# Patient Record
Sex: Male | Born: 1980 | Race: White | Hispanic: Yes | Marital: Single | Smoking: Current every day smoker
Health system: Southern US, Community
[De-identification: ages and names within clinical notes are randomized; demographics above are authoritative.]

## PROBLEM LIST (undated history)

## (undated) DIAGNOSIS — Z21 Asymptomatic human immunodeficiency virus [HIV] infection status: Secondary | ICD-10-CM

## (undated) DIAGNOSIS — N390 Urinary tract infection, site not specified: Secondary | ICD-10-CM

## (undated) DIAGNOSIS — B20 Human immunodeficiency virus [HIV] disease: Secondary | ICD-10-CM

## (undated) DIAGNOSIS — H3090 Unspecified chorioretinal inflammation, unspecified eye: Secondary | ICD-10-CM

## (undated) DIAGNOSIS — D649 Anemia, unspecified: Secondary | ICD-10-CM

## (undated) HISTORY — DX: Human immunodeficiency virus (HIV) disease: B20

## (undated) HISTORY — DX: Anemia, unspecified: D64.9

## (undated) HISTORY — DX: Asymptomatic human immunodeficiency virus (hiv) infection status: Z21

## (undated) HISTORY — PX: NO PAST SURGERIES: SHX2092

---

## 2003-10-07 ENCOUNTER — Emergency Department (HOSPITAL_COMMUNITY): Admission: EM | Admit: 2003-10-07 | Discharge: 2003-10-07 | Payer: Self-pay | Admitting: Emergency Medicine

## 2004-07-07 ENCOUNTER — Emergency Department (HOSPITAL_COMMUNITY): Admission: EM | Admit: 2004-07-07 | Discharge: 2004-07-07 | Payer: Self-pay | Admitting: Family Medicine

## 2010-04-29 ENCOUNTER — Inpatient Hospital Stay (INDEPENDENT_AMBULATORY_CARE_PROVIDER_SITE_OTHER)
Admission: RE | Admit: 2010-04-29 | Discharge: 2010-04-29 | Disposition: A | Discharge status: Home / Self Care | Payer: Self-pay | Source: Ambulatory Visit | Attending: Emergency Medicine | Admitting: Emergency Medicine

## 2010-04-29 DIAGNOSIS — B029 Zoster without complications: Secondary | ICD-10-CM

## 2011-06-16 ENCOUNTER — Emergency Department (HOSPITAL_COMMUNITY)
Admission: EM | Admit: 2011-06-16 | Discharge: 2011-06-16 | Disposition: A | Discharge status: Home / Self Care | Payer: Self-pay | Source: Home / Self Care | Attending: Family Medicine | Admitting: Family Medicine

## 2011-06-16 ENCOUNTER — Encounter (HOSPITAL_COMMUNITY): Payer: Self-pay

## 2011-06-16 DIAGNOSIS — L0231 Cutaneous abscess of buttock: Secondary | ICD-10-CM

## 2011-06-16 DIAGNOSIS — L03317 Cellulitis of buttock: Secondary | ICD-10-CM

## 2011-06-16 MED ORDER — MINOCYCLINE HCL 100 MG PO CAPS: 100.0000 mg | ORAL_CAPSULE | Freq: Two times a day (BID) | ORAL | Status: DC

## 2011-06-16 NOTE — ED Provider Notes (Signed)
Curtis Burton is a 31 y.o. male who presents to Urgent Care today for abscess on buttock. Started 1 week or so. No fevers or chills. Patient notes several tender areas along his gluteal cleft.  Yesterday he was able to get one of the abscesses to spontaneously drain.  He denies any constipation or diarrhea or abdominal pain. Aside from the tender abscesses he feels well otherwise. He works [employer].  He has no idea why he is having this as this is the first time   PMH reviewed. Otherwise healthy man ROS as above otherwise neg.  no chest pains, palpitations, fevers, chills, abdominal pain nausea or vomiting. Medications reviewed. No current facility-administered medications for this encounter.   Current Outpatient Prescriptions  Medication Sig Dispense Refill  . minocycline (MINOCIN) 100 MG capsule Take 1 capsule (100 mg total) by mouth 2 (two) times daily.  20 capsule  0    Exam:  BP 127/82  Pulse 78  Temp(Src) 98.4 F (36.9 C) (Oral)  Resp 16  SpO2 100% Gen: Well NAD Skin: Several areas of induration along the gluteal cleft bilaterally. One area on the right buttock is fluctuant.  A second area on the right buttock closer to the anus is more fluctuant and more tender.    Procedure note:  Consent obtained and timeout performed. Area cleaned with alcohol. Using a 27-gauge needle 2% lidocaine without epinephrine was injected into the 2 areas. Then a 11 blade scalpel was used to cut down to the level where pus was expressed (no more than 0.5 cm).  Then a cotton applicator was used to break any loculations and further expresse any remaining pus.  The abscess closes to the anus was larger approximately the size of a marble and was packed with packing material.  Patient tolerated the procedure well with minimal bleeding.  Assessment and Plan: 31 y.o. male with abscess on buttock.  First episode with no clear explanation. Does not display any other symptoms suggestive of Crohn's  disease. Patient is otherwise well. 2 abscesses drained several areas of induration without fluctuance. As he has several potentially forming abscesses I feel it is warranted to start oral antibiotics.  We'll use minocycline twice daily for 10 days. Encouraged followup if not improved. Additionally encouraged him to obtain the orange card so that he can followup at [facility] surgery if the symptoms persist.  Handout on abscess provided in Spanish. Patient expresses understanding.     Rodolph Bong, MD 06/16/11 [phone]

## 2011-06-16 NOTE — Discharge Instructions (Signed)
Gracias por venir hoy.  Please see Rudell Cobb to qualify for reduced or free medical services within the [facility].  Call her at [phone] today.

## 2011-06-16 NOTE — ED Notes (Signed)
Has another boil in his buttocks area

## 2011-06-17 NOTE — ED Provider Notes (Signed)
Medical screening examination/treatment/procedure(s) were performed by PGY-3 FM resident and as supervising physician I was immediately available for consultation/collaboration.   Jessenya Berdan Moreno-Coll, MD   Desha Bitner Moreno-Coll, MD 06/17/11 0032 

## 2011-09-09 ENCOUNTER — Encounter (HOSPITAL_COMMUNITY): Payer: Self-pay | Admitting: Emergency Medicine

## 2011-09-09 ENCOUNTER — Emergency Department (HOSPITAL_COMMUNITY)
Admission: EM | Admit: 2011-09-09 | Discharge: 2011-09-09 | Disposition: A | Discharge status: Home / Self Care | Payer: Self-pay | Source: Home / Self Care | Attending: Emergency Medicine | Admitting: Emergency Medicine

## 2011-09-09 DIAGNOSIS — N390 Urinary tract infection, site not specified: Secondary | ICD-10-CM

## 2011-09-09 LAB — POCT URINALYSIS DIP (DEVICE)
Bilirubin Urine: NEGATIVE
Protein, ur: 30 mg/dL — AB

## 2011-09-09 MED ORDER — SULFAMETHOXAZOLE-TRIMETHOPRIM 800-160 MG PO TABS: 1.0000 | ORAL_TABLET | Freq: Two times a day (BID) | ORAL | Status: DC

## 2011-09-09 NOTE — ED Notes (Signed)
uti symptoms

## 2011-09-09 NOTE — ED Provider Notes (Signed)
Medical screening examination/treatment/procedure(s) were performed by non-physician practitioner and as supervising physician I was immediately available for consultation/collaboration.  Raynald Blend, MD 09/09/11 2025

## 2011-09-09 NOTE — ED Provider Notes (Signed)
History     CSN: [id number]  Arrival date & time 09/09/11  1120   First MD Initiated Contact with Patient 09/09/11 1133      Chief Complaint  Patient presents with  . Urinary Tract Infection    (Consider location/radiation/quality/duration/timing/severity/associated sxs/prior treatment) HPI Comments: Pt c/o generalized lower back pain and suprapubic pain  Patient is a 31 y.o. male presenting with dysuria. The history is provided by the patient. No language interpreter was used.  Dysuria  This is a new problem. The current episode started more than 2 days ago. The problem has not changed since onset.The quality of the pain is described as burning. The pain is mild. There has been no fever. Associated symptoms include frequency. Pertinent negatives include no nausea, no vomiting, no discharge and no hematuria. His past medical history does not include kidney stones or recurrent UTIs.    History reviewed. No pertinent past medical history.  History reviewed. No pertinent past surgical history.  No family history on file.  History  Substance Use Topics  . Smoking status: Never Smoker   . Smokeless tobacco: Not on file  . Alcohol Use: No      Review of Systems  Constitutional: Negative.   Respiratory: Negative.   Cardiovascular: Negative.   Gastrointestinal: Negative for nausea and vomiting.  Genitourinary: Positive for dysuria and frequency. Negative for hematuria.       Pt denies penile discharge    Allergies  Review of patient's allergies indicates no known allergies.  Home Medications   Current Outpatient Rx  Name Route Sig Dispense Refill  . MINOCYCLINE HCL 100 MG PO CAPS Oral Take 1 capsule (100 mg total) by mouth 2 (two) times daily. 20 capsule 0    BP 132/80  Pulse 90  Temp 98.9 F (37.2 C) (Oral)  Resp 20  SpO2 100%  Physical Exam  Nursing note and vitals reviewed. Constitutional: He appears well-developed and well-nourished.  HENT:  Head:  Atraumatic.  Eyes: Conjunctivae and EOM are normal.  Neck: Neck supple.  Cardiovascular: Normal rate and regular rhythm.   Pulmonary/Chest: Breath sounds normal.  Abdominal: Soft. Bowel sounds are normal. There is no CVA tenderness.       Suprapubic tenderness  Genitourinary:       No penile discharge noted    ED Course  Procedures (including critical care time)  Labs Reviewed  POCT URINALYSIS DIP (DEVICE) - Abnormal; Notable for the following:    Hgb urine dipstick TRACE (*)     Protein, ur 30 (*)     Leukocytes, UA TRACE (*)  Biochemical Testing Only. Please order routine urinalysis from main lab if confirmatory testing is needed.   All other components within normal limits  GC/CHLAMYDIA PROBE AMP, GENITAL   No results found.   1. UTI (lower urinary tract infection)       MDM  Will treat for WUJ:WJXBJ kidney stone as pt not having focal pain to one side:pt had no discharge although cultures sent       Teressa Lower, NP 09/09/11 1212

## 2011-09-10 ENCOUNTER — Emergency Department (HOSPITAL_COMMUNITY)
Admission: EM | Admit: 2011-09-10 | Discharge: 2011-09-10 | Disposition: A | Discharge status: Home / Self Care | Payer: Self-pay | Attending: Emergency Medicine | Admitting: Emergency Medicine

## 2011-09-10 ENCOUNTER — Encounter (HOSPITAL_COMMUNITY): Payer: Self-pay | Admitting: *Deleted

## 2011-09-10 DIAGNOSIS — N390 Urinary tract infection, site not specified: Secondary | ICD-10-CM | POA: Insufficient documentation

## 2011-09-10 DIAGNOSIS — N139 Obstructive and reflux uropathy, unspecified: Secondary | ICD-10-CM | POA: Insufficient documentation

## 2011-09-10 LAB — URINALYSIS, ROUTINE W REFLEX MICROSCOPIC
Bilirubin Urine: NEGATIVE
Glucose, UA: NEGATIVE mg/dL
Ketones, ur: NEGATIVE mg/dL
Nitrite: NEGATIVE
Protein, ur: NEGATIVE mg/dL
Specific Gravity, Urine: 1.009 (ref 1.005–1.030)
Urobilinogen, UA: 0.2 mg/dL (ref 0.0–1.0)

## 2011-09-10 MED ORDER — LIDOCAINE HCL 2 % EX GEL
CUTANEOUS | Status: AC
  Filled 2011-09-10: qty 20

## 2011-09-10 NOTE — ED Notes (Signed)
PT states that he was recently tx for uti.  However, since then, pt is unable to urinate and is experiencing increasing urethral burning and suprapubic pain.

## 2011-09-10 NOTE — ED Notes (Signed)
Difficulty urinating X several days, dribbling, and burning, sx are constant, worsening and associated with abd pain and distention, tx for UTI with bactrim X 24 hours without relief.  No otc meds, no anticholinergism, no drugs, no PMH.  PE>  Distended abdomen, tender in the suprapubic area, upper abdomen is soft and nontender. No peripheral edema, clear heart and lungs, no conjunctival icterus or jaundice, patient following commands without difficulty, moving all 4 extremities.  GU exam normal, no d/c, no swelling, no redness  Assessment bedside ultrasound shows distended bladder, penis and testicles and scrotum appear normal, uncircumcised, no discharge at the urethral meatus. Foley catheter placed, urology followup encouraged, all questions of the patient and family member answered  UA clean, no UTI, cutlures sent.  Medical screening examination/treatment/procedure(s) were conducted as a shared visit with non-physician practitioner(s) and myself.  I personally evaluated the patient during the encounter    Vida Roller, MD 09/10/11 [phone]

## 2011-09-10 NOTE — ED Notes (Signed)
Pt states he was seen at urgent care last week and dx with UTI, given meds but states the pain is worse since this past Tuesday.  C/o fever, abdominal pain, and bilateral flank pain.

## 2011-09-10 NOTE — ED Provider Notes (Signed)
History     CSN: [id number]  Arrival date & time 09/10/11  0120   First MD Initiated Contact with Patient 09/10/11 0154      Chief Complaint  Patient presents with  . Flank Pain  . Abdominal Pain    (Consider location/radiation/quality/duration/timing/severity/associated sxs/prior treatment) HPI Comments: Curtis Burton 31 y.o. male   The chief complaint is: Patient presents with:   Flank Pain   Abdominal Pain   History reviewed. No pertinent past medical history.  Patient seen in ER 09/09/11 and diagnosed with UTI. Symptoms of dysuria, difficult urination, flank pain, fever present since last Tuesday. Patient also reported subjective high fevers and night sweats that caused a herpetic vesicular outbreak in his mouth and nose. Today he presents because he is unable to urinate at all. Reports all of the same prior symptoms in addition to suprapubic pain. Denies NVD. Denies hematuria. Denies history of prostate problems.      Patient is a 31 y.o. male presenting with flank pain and abdominal pain. The history is provided by the patient and the spouse.  Flank Pain Associated symptoms include abdominal pain, chills, diaphoresis and a fever. Pertinent negatives include no nausea or vomiting.  Abdominal Pain The primary symptoms of the illness include abdominal pain, fever and dysuria. The primary symptoms of the illness do not include nausea, vomiting or diarrhea.  The dysuria is not associated with penile pain.  Additional symptoms associated with the illness include chills and diaphoresis.    History reviewed. No pertinent past medical history.  History reviewed. No pertinent past surgical history.  History reviewed. No pertinent family history.  History  Substance Use Topics  . Smoking status: Never Smoker   . Smokeless tobacco: Not on file  . Alcohol Use: No      Review of Systems  Constitutional: Positive for fever, chills and diaphoresis.  HENT:  Positive for mouth sores.   Gastrointestinal: Positive for abdominal pain. Negative for nausea, vomiting and diarrhea.  Genitourinary: Positive for dysuria, flank pain and difficulty urinating. Negative for discharge and penile pain.    Allergies  Review of patient's allergies indicates no known allergies.  Home Medications   Current Outpatient Rx  Name Route Sig Dispense Refill  . IBUPROFEN 200 MG PO TABS Oral Take 400 mg by mouth every 6 (six) hours as needed. pain    . SULFAMETHOXAZOLE-TRIMETHOPRIM 800-160 MG PO TABS Oral Take 1 tablet by mouth every 12 (twelve) hours. 10 tablet 0    BP 122/81  Pulse 87  Temp 98.2 F (36.8 C) (Oral)  Resp 16  SpO2 97%  Physical Exam  Constitutional: He appears well-developed and well-nourished.  HENT:  Head: Normocephalic and atraumatic.  Mouth/Throat: Oropharynx is clear and moist.  Cardiovascular: Normal rate, regular rhythm and normal heart sounds.   Pulmonary/Chest: Effort normal and breath sounds normal.  Abdominal: Soft. Bowel sounds are normal. He exhibits distension. There is tenderness. Hernia confirmed negative in the right inguinal area and confirmed negative in the left inguinal area.       Abdomen mildly distended. Tenderness of suprapubic area on palpation.  Genitourinary: Testes normal and penis normal. Right testis shows no swelling and no tenderness. Left testis shows no swelling and no tenderness. Uncircumcised. No penile erythema or penile tenderness. No discharge found.  Neurological: He is alert.  Skin: Skin is warm and dry.    ED Course  Procedures (including critical care time)   Labs Reviewed  URINALYSIS, ROUTINE W REFLEX MICROSCOPIC  URINE CULTURE   No results found.  Results for orders placed during the hospital encounter of 09/10/11  URINALYSIS, ROUTINE W REFLEX MICROSCOPIC      Component Value Range   Color, Urine YELLOW  YELLOW   APPearance CLEAR  CLEAR   Specific Gravity, Urine 1.009  1.005 - 1.030     pH 6.5  5.0 - 8.0   Glucose, UA NEGATIVE  NEGATIVE mg/dL   Hgb urine dipstick NEGATIVE  NEGATIVE   Bilirubin Urine NEGATIVE  NEGATIVE   Ketones, ur NEGATIVE  NEGATIVE mg/dL   Protein, ur NEGATIVE  NEGATIVE mg/dL   Urobilinogen, UA 0.2  0.0 - 1.0 mg/dL   Nitrite NEGATIVE  NEGATIVE   Leukocytes, UA NEGATIVE  NEGATIVE    1. Urinary (tract) obstruction       MDM  31 y/o male previously diagnosed with UTI with worsening symptoms. Bedside US revealed urinary obstruction. Foley catheter with foley leg bag ordered and placed. UA: unremarkable. Urine culture & GC/Chlamydia pending. Patient given instructions to follow-up with [facility] in addition to return precautions.        Pixie Casino, PA-C 09/10/11 [phone]

## 2011-09-10 NOTE — ED Provider Notes (Signed)
Medical screening examination/treatment/procedure(s) were conducted as a shared visit with non-physician practitioner(s) and myself.  I personally evaluated the patient during the encounter  Please see my separate respective documentation pertaining to this patient encounter   Vida Roller, MD 09/10/11 [phone]

## 2011-09-10 NOTE — ED Notes (Signed)
Pt discharged home.GCS 15

## 2011-09-10 NOTE — ED Notes (Signed)
Foley catheter placed by EMT Jeannett Senior.

## 2011-09-11 LAB — URINE CULTURE
Colony Count: NO GROWTH
Culture: NO GROWTH

## 2011-09-11 LAB — GC/CHLAMYDIA PROBE AMP, GENITAL: Chlamydia, DNA Probe: NEGATIVE

## 2011-09-13 ENCOUNTER — Encounter (HOSPITAL_COMMUNITY): Payer: Self-pay | Admitting: *Deleted

## 2011-09-13 DIAGNOSIS — N390 Urinary tract infection, site not specified: Secondary | ICD-10-CM | POA: Insufficient documentation

## 2011-09-13 LAB — URINALYSIS, ROUTINE W REFLEX MICROSCOPIC
Glucose, UA: NEGATIVE mg/dL
Nitrite: POSITIVE — AB
Protein, ur: >300 mg/dL — AB
pH: 6 (ref 5.0–8.0)

## 2011-09-13 LAB — URINE MICROSCOPIC-ADD ON

## 2011-09-13 NOTE — ED Notes (Signed)
PT was tx here on Monday for a uti and sent home with a foley leg bag.  His symptoms were relieved, but Monday he began to experience urinary burning slightly blood-tinged urine and suprapubic pain.  He called specialist and they stated they have no openings until Wed.  Pt states he cannot wait that long.

## 2011-09-14 ENCOUNTER — Emergency Department (HOSPITAL_COMMUNITY): Payer: Self-pay

## 2011-09-14 ENCOUNTER — Encounter (HOSPITAL_COMMUNITY): Payer: Self-pay | Admitting: Radiology

## 2011-09-14 ENCOUNTER — Emergency Department (HOSPITAL_COMMUNITY)
Admission: EM | Admit: 2011-09-14 | Discharge: 2011-09-14 | Disposition: A | Discharge status: Home / Self Care | Payer: Self-pay | Attending: Emergency Medicine | Admitting: Emergency Medicine

## 2011-09-14 DIAGNOSIS — R319 Hematuria, unspecified: Secondary | ICD-10-CM

## 2011-09-14 HISTORY — DX: Urinary tract infection, site not specified: N39.0

## 2011-09-14 LAB — COMPREHENSIVE METABOLIC PANEL WITH GFR
ALT: 30 U/L (ref 0–53)
AST: 24 U/L (ref 0–37)
Albumin: 3.8 g/dL (ref 3.5–5.2)
Alkaline Phosphatase: 106 U/L (ref 39–117)
BUN: 15 mg/dL (ref 6–23)
CO2: 22 meq/L (ref 19–32)
Calcium: 9.2 mg/dL (ref 8.4–10.5)
Chloride: 99 meq/L (ref 96–112)
Creatinine, Ser: 0.78 mg/dL (ref 0.50–1.35)
GFR calc Af Amer: >90 mL/min
GFR calc non Af Amer: >90 mL/min
Glucose, Bld: 92 mg/dL (ref 70–99)
Potassium: 4.3 meq/L (ref 3.5–5.1)
Sodium: 134 meq/L — ABNORMAL LOW (ref 135–145)
Total Bilirubin: 0.2 mg/dL — ABNORMAL LOW (ref 0.3–1.2)
Total Protein: 9 g/dL — ABNORMAL HIGH (ref 6.0–8.3)

## 2011-09-14 LAB — POCT I-STAT, CHEM 8
BUN: 16 mg/dL (ref 6–23)
Calcium, Ion: 1.19 mmol/L (ref 1.12–1.23)
Chloride: 104 meq/L (ref 96–112)
Creatinine, Ser: 0.9 mg/dL (ref 0.50–1.35)
Glucose, Bld: 94 mg/dL (ref 70–99)
HCT: 43 % (ref 39.0–52.0)
Hemoglobin: 14.6 g/dL (ref 13.0–17.0)
Potassium: 4.5 meq/L (ref 3.5–5.1)
Sodium: 138 meq/L (ref 135–145)
TCO2: 21 mmol/L (ref 0–100)

## 2011-09-14 LAB — CBC
HCT: 39.4 % (ref 39.0–52.0)
Hemoglobin: 14.3 g/dL (ref 13.0–17.0)
MCH: 32.5 pg (ref 26.0–34.0)
MCHC: 36.3 g/dL — ABNORMAL HIGH (ref 30.0–36.0)
MCV: 89.5 fL (ref 78.0–100.0)
Platelets: 304 K/uL (ref 150–400)
RBC: 4.4 MIL/uL (ref 4.22–5.81)
RDW: 11.9 % (ref 11.5–15.5)
WBC: 7.6 K/uL (ref 4.0–10.5)

## 2011-09-14 LAB — CK: Total CK: 40 U/L (ref 7–232)

## 2011-09-14 MED ORDER — TRAMADOL HCL 50 MG PO TABS
50.0000 mg | ORAL_TABLET | Freq: Once | ORAL | Status: AC
  Administered 2011-09-14: 50 mg via ORAL
  Filled 2011-09-14: qty 1

## 2011-09-14 MED ORDER — IOHEXOL 300 MG/ML  SOLN
100.0000 mL | Freq: Once | INTRAMUSCULAR | Status: AC | PRN
  Administered 2011-09-14: 100 mL via INTRAVENOUS

## 2011-09-14 MED ORDER — SODIUM CHLORIDE 0.9 % IV BOLUS (SEPSIS)
1000.0000 mL | Freq: Once | INTRAVENOUS | Status: AC
  Administered 2011-09-14: 1000 mL via INTRAVENOUS

## 2011-09-14 MED ORDER — TRAMADOL HCL 50 MG PO TABS: 50.0000 mg | ORAL_TABLET | Freq: Four times a day (QID) | ORAL | Status: DC | PRN

## 2011-09-14 MED ORDER — TAMSULOSIN HCL 0.4 MG PO CAPS: 0.4000 mg | ORAL_CAPSULE | Freq: Every day | ORAL | Status: DC

## 2011-09-14 NOTE — ED Notes (Signed)
Wife st's pt was seen here on Sat and dx with UTI st's he was having problems urinating.  Pt still has cath in place. St's there has been blood in urine.  Pt is currently taking Bactrim.  Pt's wife st's she called urologist to obtain an appt. But could not get one till next week.

## 2011-09-14 NOTE — ED Notes (Signed)
Patient requested something for pain before leaving.  MD notified

## 2011-09-14 NOTE — ED Provider Notes (Signed)
History     CSN: [id number]  Arrival date & time 09/13/11  2153   First MD Initiated Contact with Patient 09/14/11 0020      Chief Complaint  Patient presents with  . Urinary Tract Infection    complictions    (Consider location/radiation/quality/duration/timing/severity/associated sxs/prior treatment) HPI History per patient. Has Foley catheter in place for urinary retention. Evaluated here a few days ago for UTI and prescribed antibiotics. He returned a few days later with retention had a Foley placed. He was referred to urologist and did call urologist earlier today and is scheduled followup in 10 days with [facility]. Today he is having some suprapubic discomfort with blood in his urine and presents for further evaluation. Did have a fever a week ago but no fever since. No nausea or vomiting. No lateralizing pain. Pain is mild. No sore throat. No rash. No back pain. No history of UTIs previously. Past Medical History  Diagnosis Date  . UTI (urinary tract infection)     History reviewed. No pertinent past surgical history.  No family history on file.  History  Substance Use Topics  . Smoking status: Never Smoker   . Smokeless tobacco: Not on file  . Alcohol Use: No      Review of Systems  Constitutional: Negative for fever and chills.  HENT: Negative for neck pain and neck stiffness.   Eyes: Negative for pain.  Respiratory: Negative for shortness of breath.   Cardiovascular: Negative for chest pain.  Gastrointestinal: Negative for abdominal pain.  Genitourinary: Positive for hematuria. Negative for flank pain, discharge, penile swelling, scrotal swelling and testicular pain.  Musculoskeletal: Negative for back pain.  Skin: Negative for rash.  Neurological: Negative for headaches.  All other systems reviewed and are negative.    Allergies  Review of patient's allergies indicates no known allergies.  Home Medications   Current Outpatient Rx  Name  Route Sig Dispense Refill  . IBUPROFEN 200 MG PO TABS Oral Take 400 mg by mouth every 6 (six) hours as needed. pain    . SULFAMETHOXAZOLE-TRIMETHOPRIM 800-160 MG PO TABS Oral Take 1 tablet by mouth every 12 (twelve) hours. 10 tablet 0    BP 128/74  Pulse 90  Temp 98.4 F (36.9 C) (Oral)  Resp 16  SpO2 97%  Physical Exam  Constitutional: He is oriented to person, place, and time. He appears well-developed and well-nourished.  HENT:  Head: Normocephalic and atraumatic.  Eyes: Conjunctivae and EOM are normal. Pupils are equal, round, and reactive to light.  Neck: Trachea normal. Neck supple. No thyromegaly present.  Cardiovascular: Normal rate, regular rhythm, S1 normal, S2 normal and normal pulses.     No systolic murmur is present   No diastolic murmur is present  Pulses:      Radial pulses are 2+ on the right side, and 2+ on the left side.  Pulmonary/Chest: Effort normal and breath sounds normal. He has no wheezes. He has no rhonchi. He has no rales. He exhibits no tenderness.  Abdominal: Soft. Normal appearance and bowel sounds are normal. He exhibits no distension. There is no rebound, no guarding, no CVA tenderness and negative Murphy's sign.       Mild suprapubic tenderness. No tenderness over McBurney's point. No peritonitis. Foley catheter in place with hematuria but no blood clots visualized. No fullness or distention over the bladder  Musculoskeletal:       BLE:s Calves nontender, no cords or erythema, negative Homans sign  Neurological:  He is alert and oriented to person, place, and time. He has normal strength. No cranial nerve deficit or sensory deficit. GCS eye subscore is 4. GCS verbal subscore is 5. GCS motor subscore is 6.  Skin: Skin is warm and dry. No rash noted. He is not diaphoretic.  Psychiatric: His speech is normal.       Cooperative and appropriate    ED Course  Procedures (including critical care time)  Results for orders placed during the hospital  encounter of 09/14/11  URINALYSIS, ROUTINE W REFLEX MICROSCOPIC      Component Value Range   Color, Urine BROWN (*) YELLOW   APPearance TURBID (*) CLEAR   Specific Gravity, Urine 1.037 (*) 1.005 - 1.030   pH 6.0  5.0 - 8.0   Glucose, UA NEGATIVE  NEGATIVE mg/dL   Hgb urine dipstick LARGE (*) NEGATIVE   Bilirubin Urine LARGE (*) NEGATIVE   Ketones, ur 15 (*) NEGATIVE mg/dL   Protein, ur >045 (*) NEGATIVE mg/dL   Urobilinogen, UA 0.2  0.0 - 1.0 mg/dL   Nitrite POSITIVE (*) NEGATIVE   Leukocytes, UA SMALL (*) NEGATIVE  URINE MICROSCOPIC-ADD ON      Component Value Range   Squamous Epithelial / LPF RARE  RARE   WBC, UA 3-6  <3 WBC/hpf   RBC / HPF TOO NUMEROUS TO COUNT  <3 RBC/hpf   Bacteria, UA FEW (*) RARE   Crystals CA OXALATE CRYSTALS (*) NEGATIVE  CBC      Component Value Range   WBC 7.6  4.0 - 10.5 K/uL   RBC 4.40  4.22 - 5.81 MIL/uL   Hemoglobin 14.3  13.0 - 17.0 g/dL   HCT 40.9  81.1 - 91.4 %   MCV 89.5  78.0 - 100.0 fL   MCH 32.5  26.0 - 34.0 pg   MCHC 36.3 (*) 30.0 - 36.0 g/dL   RDW 78.2  95.6 - 21.3 %   Platelets 304  150 - 400 K/uL  COMPREHENSIVE METABOLIC PANEL      Component Value Range   Sodium 134 (*) 135 - 145 mEq/L   Potassium 4.3  3.5 - 5.1 mEq/L   Chloride 99  96 - 112 mEq/L   CO2 22  19 - 32 mEq/L   Glucose, Bld 92  70 - 99 mg/dL   BUN 15  6 - 23 mg/dL   Creatinine, Ser 0.86  0.50 - 1.35 mg/dL   Calcium 9.2  8.4 - 57.8 mg/dL   Total Protein 9.0 (*) 6.0 - 8.3 g/dL   Albumin 3.8  3.5 - 5.2 g/dL   AST 24  0 - 37 U/L   ALT 30  0 - 53 U/L   Alkaline Phosphatase 106  39 - 117 U/L   Total Bilirubin 0.2 (*) 0.3 - 1.2 mg/dL   GFR calc non Af Amer >90  >90 mL/min   GFR calc Af Amer >90  >90 mL/min  CK      Component Value Range   Total CK 40  7 - 232 U/L  POCT I-STAT, CHEM 8      Component Value Range   Sodium 138  135 - 145 mEq/L   Potassium 4.5  3.5 - 5.1 mEq/L   Chloride 104  96 - 112 mEq/L   BUN 16  6 - 23 mg/dL   Creatinine, Ser 4.69  0.50 - 1.35  mg/dL   Glucose, Bld 94  70 - 99 mg/dL   Calcium, Ion  1.19  1.12 - 1.23 mmol/L   TCO2 21  0 - 100 mmol/L   Hemoglobin 14.6  13.0 - 17.0 g/dL   HCT 16.1  09.6 - 04.5 %   Ct Abdomen Pelvis Wo Contrast  09/14/2011  *RADIOLOGY REPORT*  Clinical Data: The low pelvic pain and hematuria for 1 week.  CT ABDOMEN AND PELVIS WITHOUT CONTRAST  Technique:  Multidetector CT imaging of the abdomen and pelvis was performed following the standard protocol without intravenous contrast.  Comparison: None.  Findings: Dependent changes in the lung bases.  The kidneys appear symmetrical in size and shape.  No renal, ureteral, or bladder stones are visualized.  No pyelocaliectasis or ureterectasis.  A Foley catheter decompresses the bladder.  This limits evaluation of the bladder.  The prostate gland is enlarged, measuring 4.6 x 5.6 cm.  The unenhanced appearance of the liver, spleen, gallbladder, pancreas, adrenal glands, abdominal aorta, and retroperitoneal lymph nodes is unremarkable.  The stomach, small bowel, and colon are not abnormally distended.  Diffusely stool filled colon.  No free air or free fluid in the abdomen.  Pelvis:  No free or loculated pelvic fluid collections.  No significant pelvic lymphadenopathy.  Scattered colonic diverticula without diverticulitis.  The appendix is normal.  Normal alignment of the lumbar spine.  IMPRESSION: No renal or ureteral stone or obstruction.  Foley catheter decompresses the bladder.  Prostate gland enlargement.  Original Report Authenticated By: Marlon Pel, M.D.     12:53 AM d/w Urologist on call, Dr Mena Goes, as above -relates that PT does not need emergent follow up and 10 day appt for foley removal is appropriate. CT scan is appropriate at this point to evaluate for stone. Options include voiding trial with foley removal now or RX tamulosin and f/u with Urology as planned for cystoscopy.    MDM   Suprapubic pain status post treatment for UTI and Foley catheter  placement for urinary retention. Old records reviewed. Negative urine culture. Has one pill left in prescription of antibiotics. Now with hematuria. UA, labs and CT scan obtained and reviewed as above. Patient declines any pain medications serial exams unchanged. given enlarged prostate, will prescribe Tamulosin as above plan followup with urologist as scheduled. Patient is comfortable for plan discharge home and outpatient followup.        Sunnie Nielsen, MD 09/14/11 [phone]

## 2011-09-14 NOTE — Discharge Instructions (Signed)
Continue medications as prescribed. Continue wearing Foley catheter as instructed. Be evaluated sooner for fevers, vomiting, severe pain or any worsening condition. Otherwise keep your scheduled followup with [facility].

## 2011-09-16 LAB — URINE CULTURE: Colony Count: 15000

## 2011-09-21 ENCOUNTER — Emergency Department (HOSPITAL_COMMUNITY)
Admission: EM | Admit: 2011-09-21 | Discharge: 2011-09-21 | Disposition: A | Discharge status: Home / Self Care | Payer: Self-pay | Attending: Emergency Medicine | Admitting: Emergency Medicine

## 2011-09-21 ENCOUNTER — Encounter (HOSPITAL_COMMUNITY): Payer: Self-pay | Admitting: *Deleted

## 2011-09-21 DIAGNOSIS — R339 Retention of urine, unspecified: Secondary | ICD-10-CM | POA: Insufficient documentation

## 2011-09-21 DIAGNOSIS — F17200 Nicotine dependence, unspecified, uncomplicated: Secondary | ICD-10-CM | POA: Insufficient documentation

## 2011-09-21 LAB — URINALYSIS, ROUTINE W REFLEX MICROSCOPIC
Bilirubin Urine: NEGATIVE
Ketones, ur: NEGATIVE mg/dL
Nitrite: NEGATIVE
Specific Gravity, Urine: 1.016 (ref 1.005–1.030)
Urobilinogen, UA: 0.2 mg/dL (ref 0.0–1.0)

## 2011-09-21 NOTE — ED Provider Notes (Signed)
History     CSN: [id number]  Arrival date & time 09/21/11  0002   First MD Initiated Contact with Patient 09/21/11 0034      Chief Complaint  Patient presents with  . Urinary Retention    (Consider location/radiation/quality/duration/timing/severity/associated sxs/prior treatment) HPI Comments: 31 year old male with no past medical history other than recent urinary retention presents with lower abdominal pain. According to the patient and his family member he had a Foley catheter removed at the urologist office approximately 14 hours ago. He was able to urinate once immediately after this but since that time has not been able to pass any urine whatsoever from his penis. He denies flank pain fevers chills nausea or vomiting but does admit to having gradually worsening lower abdominal pain and distention. Review of the medical records shows that he had a recent urinalysis showing no signs of urinary tract infection on culture results with only 15,000 colonies. He is currently taking Flomax without any improvement.  The history is provided by the patient and a relative.    Past Medical History  Diagnosis Date  . UTI (urinary tract infection)     History reviewed. No pertinent past surgical history.  History reviewed. No pertinent family history.  History  Substance Use Topics  . Smoking status: Current Everyday Smoker -- 0.5 packs/day  . Smokeless tobacco: Not on file  . Alcohol Use: No      Review of Systems  All other systems reviewed and are negative.    Allergies  Review of patient's allergies indicates no known allergies.  Home Medications   Current Outpatient Rx  Name Route Sig Dispense Refill  . IBUPROFEN 200 MG PO TABS Oral Take 400 mg by mouth every 6 (six) hours as needed. pain    . TAMSULOSIN HCL 0.4 MG PO CAPS Oral Take 1 capsule (0.4 mg total) by mouth daily after breakfast. 30 capsule 0    BP 123/70  Pulse 79  Temp 98.1 F (36.7 C) (Oral)  Resp 16   SpO2 97%  Physical Exam  Nursing note and vitals reviewed. Constitutional: He appears well-developed and well-nourished. No distress.  HENT:  Head: Normocephalic and atraumatic.  Mouth/Throat: Oropharynx is clear and moist. No oropharyngeal exudate.  Eyes: Conjunctivae and EOM are normal. Pupils are equal, round, and reactive to light. Right eye exhibits no discharge. Left eye exhibits no discharge. No scleral icterus.  Neck: Normal range of motion. Neck supple. No JVD present. No thyromegaly present.  Cardiovascular: Normal rate, regular rhythm, normal heart sounds and intact distal pulses.  Exam reveals no gallop and no friction rub.   No murmur heard. Pulmonary/Chest: Effort normal and breath sounds normal. No respiratory distress. He has no wheezes. He has no rales.  Abdominal: Soft. Bowel sounds are normal. He exhibits distension ( Mild distention in the infraumbilical area). He exhibits no mass. There is tenderness ( Mild suprapubic tenderness with no guarding).       No peritoneal signs  Musculoskeletal: Normal range of motion. He exhibits no edema and no tenderness.  Lymphadenopathy:    He has no cervical adenopathy.  Neurological: He is alert. Coordination normal.  Skin: Skin is warm and dry. No rash noted. No erythema.  Psychiatric: He has a normal mood and affect. His behavior is normal.    ED Course  Procedures (including critical care time)   Labs Reviewed  URINALYSIS, ROUTINE W REFLEX MICROSCOPIC   No results found.   1. Urinary retention  MDM  CT scan reviewed from prior visit, no signs of ureteral calculi, no other signs to suggest a cause for a wet obstruction. Urinalysis ordered, Foley catheter to be placed and followup with urology. The patient is not a fever tachycardia hypotension or any other sources of illness.   Urinary catheter placed, 800 cc plus of urine returned, urinalysis reviewed showing no signs of infection, vital signs normal, patient  advised to return to urology in coming week. Patient appears stable for discharge  Vida Roller, MD 09/21/11 [phone]

## 2011-09-21 NOTE — ED Notes (Signed)
Pt given discharge and follow up instructions after speaking with provider. Denies further needs at this time. Ambulates to lobby in NAD  

## 2011-09-21 NOTE — ED Notes (Signed)
800 ML urine out in foley bag. Changed to leg bag per order. Pt reports moderate relief of pain after foley catheter placement. Continues to report mild back pain. MD aware

## 2011-09-21 NOTE — ED Notes (Signed)
Pt c/o urinary retention, states went to PCP today for urinary retention.  Straight cath was done to relieve around 10:30 AM.  Pt states he has not urinated since.

## 2011-11-06 ENCOUNTER — Telehealth: Payer: Self-pay

## 2011-11-06 NOTE — Telephone Encounter (Signed)
Pt informed of new intake appointment on November 16, 2011 @ 3pm He is spanish speaking only.  Pt informed a Artist will be calling regarding information to bring for services.   Laurell Josephs, RN

## 2011-11-16 ENCOUNTER — Ambulatory Visit: Payer: Self-pay

## 2012-03-06 ENCOUNTER — Inpatient Hospital Stay (HOSPITAL_COMMUNITY)
Admission: AD | Admit: 2012-03-06 | Discharge: 2012-03-08 | DRG: 124 | Disposition: A | Discharge status: Home / Self Care | Payer: Medicaid Other | Source: Ambulatory Visit | Attending: Internal Medicine | Admitting: Internal Medicine

## 2012-03-06 ENCOUNTER — Encounter (HOSPITAL_COMMUNITY): Payer: Self-pay | Admitting: General Practice

## 2012-03-06 ENCOUNTER — Telehealth: Payer: Self-pay | Admitting: Internal Medicine

## 2012-03-06 DIAGNOSIS — H30039 Focal chorioretinal inflammation, peripheral, unspecified eye: Principal | ICD-10-CM | POA: Diagnosis present

## 2012-03-06 DIAGNOSIS — R634 Abnormal weight loss: Secondary | ICD-10-CM | POA: Diagnosis present

## 2012-03-06 DIAGNOSIS — D649 Anemia, unspecified: Secondary | ICD-10-CM | POA: Diagnosis present

## 2012-03-06 DIAGNOSIS — F17200 Nicotine dependence, unspecified, uncomplicated: Secondary | ICD-10-CM | POA: Diagnosis present

## 2012-03-06 DIAGNOSIS — D638 Anemia in other chronic diseases classified elsewhere: Secondary | ICD-10-CM | POA: Diagnosis present

## 2012-03-06 DIAGNOSIS — B20 Human immunodeficiency virus [HIV] disease: Secondary | ICD-10-CM | POA: Diagnosis present

## 2012-03-06 DIAGNOSIS — IMO0002 Reserved for concepts with insufficient information to code with codable children: Secondary | ICD-10-CM

## 2012-03-06 HISTORY — DX: Unspecified chorioretinal inflammation, unspecified eye: H30.90

## 2012-03-06 LAB — CBC WITH DIFFERENTIAL/PLATELET
Basophils Absolute: 0 10*3/uL (ref 0.0–0.1)
Basophils Relative: 0 % (ref 0–1)
Eosinophils Absolute: 0.1 10*3/uL (ref 0.0–0.7)
MCH: 30.7 pg (ref 26.0–34.0)
MCHC: 34.8 g/dL (ref 30.0–36.0)
Neutrophils Relative %: 70 % (ref 43–77)
Platelets: 204 10*3/uL (ref 150–400)
RBC: 3.81 MIL/uL — ABNORMAL LOW (ref 4.22–5.81)
RDW: 12.9 % (ref 11.5–15.5)

## 2012-03-06 LAB — COMPREHENSIVE METABOLIC PANEL
CO2: 26 mEq/L (ref 19–32)
Calcium: 8.8 mg/dL (ref 8.4–10.5)
Creatinine, Ser: 0.57 mg/dL (ref 0.50–1.35)
GFR calc Af Amer: >90 mL/min (ref >90)
GFR calc non Af Amer: >90 mL/min (ref >90)
Glucose, Bld: 121 mg/dL — ABNORMAL HIGH (ref 70–99)
Total Bilirubin: 0.2 mg/dL — ABNORMAL LOW (ref 0.3–1.2)

## 2012-03-06 LAB — PROTIME-INR: Prothrombin Time: 14.1 seconds (ref 11.6–15.2)

## 2012-03-06 MED ORDER — SODIUM CHLORIDE 0.9 % IV SOLN
5.0000 mg/kg | Freq: Two times a day (BID) | INTRAVENOUS | Status: DC
  Administered 2012-03-06 – 2012-03-08 (×5): 305 mg via INTRAVENOUS
  Filled 2012-03-06 (×8): qty 305

## 2012-03-06 MED ORDER — ACETAMINOPHEN 650 MG RE SUPP: 650.0000 mg | Freq: Four times a day (QID) | RECTAL | Status: DC | PRN

## 2012-03-06 MED ORDER — ATROPINE SULFATE 1 % OP SOLN
1.0000 [drp] | Freq: Two times a day (BID) | OPHTHALMIC | Status: DC
  Administered 2012-03-06 – 2012-03-08 (×4): 1 [drp] via OPHTHALMIC
  Filled 2012-03-06 (×2): qty 2

## 2012-03-06 MED ORDER — ENOXAPARIN SODIUM 40 MG/0.4ML ~~LOC~~ SOLN
40.0000 mg | SUBCUTANEOUS | Status: DC
  Administered 2012-03-08: 40 mg via SUBCUTANEOUS
  Filled 2012-03-06 (×2): qty 0.4

## 2012-03-06 MED ORDER — ONDANSETRON HCL 4 MG PO TABS: 4.0000 mg | ORAL_TABLET | Freq: Four times a day (QID) | ORAL | Status: DC | PRN

## 2012-03-06 MED ORDER — PREDNISOLONE ACETATE 1 % OP SUSP
1.0000 [drp] | Freq: Four times a day (QID) | OPHTHALMIC | Status: DC
  Administered 2012-03-06 – 2012-03-08 (×8): 1 [drp] via OPHTHALMIC
  Filled 2012-03-06: qty 1
  Filled 2012-03-06: qty 5

## 2012-03-06 MED ORDER — ACETAMINOPHEN 325 MG PO TABS: 650.0000 mg | ORAL_TABLET | Freq: Four times a day (QID) | ORAL | Status: DC | PRN

## 2012-03-06 MED ORDER — SODIUM CHLORIDE 0.9 % IV SOLN: 5.0000 mg/kg | Freq: Two times a day (BID) | INTRAVENOUS | Status: DC

## 2012-03-06 MED ORDER — ONDANSETRON HCL 4 MG/2ML IJ SOLN: 4.0000 mg | Freq: Four times a day (QID) | INTRAMUSCULAR | Status: DC | PRN

## 2012-03-06 NOTE — Telephone Encounter (Signed)
32 y/o spanish speaking only. 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 Stephannie Li office with opthalmology [phone]) with decreased vision in left eye (20/350) and right eye (20/30).  Dr. Allyne Gee recommended IV ganciclovir (5 mg/kg) which can be transitioned oral valacyclovir. Will need case manager help with obtaining medications.  Per Dr. Allyne Gee patient hemodynamically stable (has not checked vitals in office). Requested Med-Surg bed.  Will need labs HIV, CMV, CBC and CMET on admission.  Curtis Burton A, MD 03/06/2012, 12:39 PM

## 2012-03-06 NOTE — H&P (Signed)
Patient's PCP: No PCP  Chief Complaint: Decreased vision in both eyes  History of Present Illness: Curtis Burton is a 32 y.o. Hispanic male who mainly speaks Spanish with no significant past medical history who presents with the above complaints.  Most of the history was obtained from patient's close friend who is present in the room after getting consent from the patient to help translate.  He reports that about 3 weeks ago he had itching and redness in his left eye which since then has become more blurry.  Over the last 2 days he has had itching in his right eye with blurriness starting in his right eye.  He had initially seen Dr. Mia Creek, with ophthalmology and has been prescribed atropine and prednisolone drops without any relief.  Patient was seen by Dr. Allyne Gee, ophthalmology today given his decreased vision and given lack of insurance cannot afford oral valacyclovir, recommended admission for further care and management.  Patient denies any recent fevers, chills, nausea, vomiting, chest pain, shortness of breath, abdominal pain, or diarrhea.  He does complain of right-sided scalp itching.  He also reports losing approximately 20 pounds unintentionally in the last 4-5 months.  Review of Systems: All systems reviewed with the patient and positive as per history of present illness, otherwise all other systems are negative.  Past Medical History  Diagnosis Date  . UTI (urinary tract infection)   . Retinitis     PERIFERAL FOCAL   Past Surgical History  Procedure Date  . No past surgeries    History reviewed. No pertinent family history. History   Social History  . Marital Status: Single    Spouse Name: N/A    Number of Children: N/A  . Years of Education: N/A   Occupational History  . Not on file.   Social History Main Topics  . Smoking status: Current Every Day Smoker -- 0.2 packs/day for 15 years    Types: Cigarettes  . Smokeless tobacco: Never Used  . Alcohol  Use: Yes     Comment: Quit drinking 2 weeks ago.  . Drug Use: No  . Sexually Active:    Other Topics Concern  . Not on file   Social History Narrative  . No narrative on file   Allergies: Review of patient's allergies indicates no known allergies.  Home Meds: Prior to Admission medications   Not on File    Physical Exam: Blood pressure 117/74, pulse 91, temperature 99.3 F (37.4 C), temperature source Oral, resp. rate 18, height 5\' 4"  (1.626 m), weight 60.7 kg (133 lb 13.1 oz), SpO2 99.00%. General: Awake, Oriented x3, No acute distress. HEENT: EOMI, Moist mucous membranes, no conjunctival erythema. Neck: Supple CV: S1 and S2 Lungs: Clear to ascultation bilaterally Abdomen: Soft, Nontender, Nondistended, +bowel sounds. Ext: Good pulses. Trace edema. No clubbing or cyanosis noted. Neuro: Cranial Nerves II-XII grossly intact. Has 5/5 motor strength in upper and lower extremities.  Lab results:  [facility] 03/06/12 1557  NA 136  K 4.2  CL 101  CO2 26  GLUCOSE 121*  BUN 12  CREATININE 0.57  CALCIUM 8.8  MG --  PHOS --    Basename 03/06/12 1557  AST 22  ALT 20  ALKPHOS 79  BILITOT 0.2*  PROT 7.7  ALBUMIN 3.2*   No results found for this basename: LIPASE:2,AMYLASE:2 in the last 72 hours  Basename 03/06/12 1556  WBC 5.0  NEUTROABS 3.5  HGB 11.7*  HCT 33.6*  MCV 88.2  PLT 204  No results found for this basename: CKTOTAL:3,CKMB:3,CKMBINDEX:3,TROPONINI:3 in the last 72 hours No components found with this basename: POCBNP:3 No results found for this basename: DDIMER in the last 72 hours No results found for this basename: HGBA1C:2 in the last 72 hours No results found for this basename: CHOL:2,HDL:2,LDLCALC:2,TRIG:2,CHOLHDL:2,LDLDIRECT:2 in the last 72 hours No results found for this basename: TSH,T4TOTAL,FREET3,T3FREE,THYROIDAB in the last 72 hours No results found for this basename: VITAMINB12:2,FOLATE:2,FERRITIN:2,TIBC:2,IRON:2,RETICCTPCT:2 in the last 72  hours Imaging results:  No results found.  Assessment & Plan by Problem: Peripheral focal retinitis Etiology unclear.  Discussed with Dr. Allyne Gee, who suspects this is likely due to CMV.  Send CMV panel.  Start the patient on IV ganciclovir 5 mg per kilogram twice daily.  Per Dr. Allyne Gee can be transitioned to oral valacyclovir at discharge.  Discussed with case manager who indicated that this is possible to arrange for valacyclovir for the patient at discharge.  Check HIV.  Also per discussion with Dr. Allyne Gee, start patient on atropine eyedrops both eyes twice daily and topical Pred forte 4 times a day both eyes.  Unintentional weight loss Etiology unclear.  Check HIV.  Will likely need further evaluation as outpatient if HIV is negative.  Anemia Check anemia panel tomorrow.  Requested case manager assistance for medications and primary care physician.  Prophylaxis Lovenox.  CODE STATUS Full code  Disposition Admit the patient as inpatient.  Time spent on admission, talking to the patient, and coordinating care was: 60 mins.  Catrina Fellenz A, MD 03/06/2012, 6:18 PM

## 2012-03-07 DIAGNOSIS — B20 Human immunodeficiency virus [HIV] disease: Secondary | ICD-10-CM

## 2012-03-07 DIAGNOSIS — H3090 Unspecified chorioretinal inflammation, unspecified eye: Secondary | ICD-10-CM

## 2012-03-07 DIAGNOSIS — B259 Cytomegaloviral disease, unspecified: Secondary | ICD-10-CM

## 2012-03-07 DIAGNOSIS — D649 Anemia, unspecified: Secondary | ICD-10-CM

## 2012-03-07 LAB — RETICULOCYTES
RBC.: 3.95 MIL/uL — ABNORMAL LOW (ref 4.22–5.81)
Retic Count, Absolute: 27.7 10*3/uL (ref 19.0–186.0)
Retic Ct Pct: 0.7 % (ref 0.4–3.1)

## 2012-03-07 LAB — CBC
MCH: 30.9 pg (ref 26.0–34.0)
MCHC: 35 g/dL (ref 30.0–36.0)
Platelets: 196 10*3/uL (ref 150–400)
RDW: 12.8 % (ref 11.5–15.5)

## 2012-03-07 LAB — FOLATE: Folate: 8.8 ng/mL

## 2012-03-07 LAB — VITAMIN B12: Vitamin B-12: 508 pg/mL (ref 211–911)

## 2012-03-07 LAB — HIV ANTIBODY (ROUTINE TESTING W REFLEX): HIV: REACTIVE — AB

## 2012-03-07 MED ORDER — SULFAMETHOXAZOLE-TMP DS 800-160 MG PO TABS
1.0000 | ORAL_TABLET | Freq: Every day | ORAL | Status: DC
  Administered 2012-03-08: 1 via ORAL
  Filled 2012-03-07: qty 1

## 2012-03-07 MED ORDER — AZITHROMYCIN 600 MG PO TABS
1200.0000 mg | ORAL_TABLET | ORAL | Status: DC
  Administered 2012-03-07: 1200 mg via ORAL
  Filled 2012-03-07: qty 2

## 2012-03-07 MED ORDER — SULFAMETHOXAZOLE-TMP DS 800-160 MG PO TABS
1.0000 | ORAL_TABLET | Freq: Two times a day (BID) | ORAL | Status: DC
  Administered 2012-03-07: 1 via ORAL
  Filled 2012-03-07 (×2): qty 1

## 2012-03-07 NOTE — Care Management Note (Signed)
  Page 2 of 2   03/08/2012     2:34:15 PM   CARE MANAGEMENT NOTE 03/08/2012  Patient:  Curtis Burton, Curtis Burton   Account Number:  [id number]  Date Initiated:  03/07/2012  Documentation initiated by:  Ronny Flurry  Subjective/Objective Assessment:   DX:  ? 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 -042+, checking viral load, genotype and CD 4 count, surely will be CD4 count will be <50.     Action/Plan:   Anticipated DC Date:  03/08/2012   Anticipated DC Plan:  HOME W HOME HEALTH SERVICES  In-house referral  Financial Counselor      DC Planning Services  [facility] Program      Choice offered to / List presented to:          [facility] arranged  HH-1 RN      [facility] agency  [facility]   Status of service:  In process, will continue to follow Medicare Important Message given?   (If response is "NO", the following Medicare IM given date fields will be blank) Date Medicare IM given:   Date Additional Medicare IM given:    Discharge Disposition:  HOME W HOME HEALTH SERVICES  Per UR Regulation:    If discussed at Long Length of Stay Meetings, dates discussed:    Comments:  03-08-12 MATCH letter given to patient's wife and explained . List of Primary Care Resources also given to patient's wife . Voiced understanding regarding both.  Ronny Flurry RN BSN [phone]   03-08-12 Patient to have PICC line placed today , finanial counselor will see patient at bedside today at 1430 to start Medicaid application .  2000 dose of ganciclovir will have to be given here at hospital today before discharge. Bedside nurse and IV nurse aware.  Ronny Flurry RN BSn [phone]   03-08-12 Referral for home health RN for IV ganciclovir (CYTOVENE) 305 mg IVPB  Intravenous Frequency: Every 12 hours  x 14 days .  Referral made to [facility]. Called finanial counselor to be sure medicaid application has been started before discharge. Patient has [place] [facility] and has filed taxes in [place] for last 5  years.  Ronny Flurry RN BSN [phone]     03-06-12 Referral for assiatance with Valtrex PO at discharge 1000mg  Q 8 hrs x 2 weeks, approval received from Nicolasa Ducking , [facility] will cover cost.  On discharge day will give patient MATCH letter , he will have $3.00 co pay and a list of pharmacies he can go to , to fill prescription.  Ronny Flurry RN BSN [phone]   03-07-12 New DX 042 no Health Insurance , have contacted Paramedic at [facility] , Selena Batten [phone] for assistance with medications .  Ronny Flurry RN BSN [phone]

## 2012-03-07 NOTE — Consult Note (Signed)
Reason for Consult:CMV retinitis  Referring Physician: Dr Belva Crome Daxen Curtis Burton is an 32 y.o. male.  HPI: Pt seen as outpatient as a referral from local Ophthalmologist for loss of vision. Presentation was suspicious for CMV retinitis. 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 UTI (urinary tract infection)   . Retinitis     PERIFERAL FOCAL    Past Surgical History  Procedure Date  . No past surgeries     History reviewed. No pertinent family history.  Social History:  reports that he has been smoking Cigarettes.  He has a 3.75 pack-year smoking history. He has never used smokeless tobacco. He reports that he drinks alcohol. He reports that he does not use illicit drugs.  Allergies: No Known Allergies  Medications: I have reviewed the patient's current medications.  Results for orders placed during the hospital encounter of 03/06/12 (from the past 48 hour(s))  CBC WITH DIFFERENTIAL     Status: Abnormal   Collection Time   03/06/12  3:56 PM      Component Value Range Comment   WBC 5.0  4.0 - 10.5 K/uL    RBC 3.81 (*) 4.22 - 5.81 MIL/uL    Hemoglobin 11.7 (*) 13.0 - 17.0 g/dL    HCT 62.1 (*) 30.8 - 52.0 %    MCV 88.2  78.0 - 100.0 fL    MCH 30.7  26.0 - 34.0 pg    MCHC 34.8  30.0 - 36.0 g/dL    RDW 65.7  84.6 - 96.2 %    Platelets 204  150 - 400 K/uL    Neutrophils Relative 70  43 - 77 %    Neutro Abs 3.5  1.7 - 7.7 K/uL    Lymphocytes Relative 20  12 - 46 %    Lymphs Abs 1.0  0.7 - 4.0 K/uL    Monocytes Relative 8  3 - 12 %    Monocytes Absolute 0.4  0.1 - 1.0 K/uL    Eosinophils Relative 2  0 - 5 %    Eosinophils Absolute 0.1  0.0 - 0.7 K/uL    Basophils Relative 0  0 - 1 %    Basophils Absolute 0.0  0.0 - 0.1 K/uL   HIV ANTIBODY (ROUTINE TESTING)     Status: Abnormal   Collection Time   03/06/12  3:56 PM      Component Value Range  Comment   HIV Reactive (*) NON REACTIVE   PROTIME-INR     Status: Normal   Collection Time   03/06/12  3:56 PM      Component Value Range Comment   Prothrombin Time 14.1  11.6 - 15.2 seconds    INR 1.10  0.00 - 1.49   COMPREHENSIVE METABOLIC PANEL     Status: Abnormal   Collection Time   03/06/12  3:57 PM      Component Value Range Comment   Sodium 136  135 - 145 mEq/L    Potassium 4.2  3.5 - 5.1 mEq/L    Chloride 101  96 - 112 mEq/L    CO2 26  19 - 32 mEq/L    Glucose, Bld 121 (*) 70 - 99 mg/dL    BUN 12  6 - 23 mg/dL    Creatinine, Ser 9.52  0.50 - 1.35 mg/dL    Calcium  8.8  8.4 - 10.5 mg/dL    Total Protein 7.7  6.0 - 8.3 g/dL    Albumin 3.2 (*) 3.5 - 5.2 g/dL    AST 22  0 - 37 U/L    ALT 20  0 - 53 U/L    Alkaline Phosphatase 79  39 - 117 U/L    Total Bilirubin 0.2 (*) 0.3 - 1.2 mg/dL    GFR calc non Af Amer >90  >90 mL/min    GFR calc Af Amer >90  >90 mL/min   CBC     Status: Abnormal   Collection Time   03/07/12  5:20 AM      Component Value Range Comment   WBC 5.4  4.0 - 10.5 K/uL    RBC 3.95 (*) 4.22 - 5.81 MIL/uL    Hemoglobin 12.2 (*) 13.0 - 17.0 g/dL    HCT 16.1 (*) 09.6 - 52.0 %    MCV 88.4  78.0 - 100.0 fL    MCH 30.9  26.0 - 34.0 pg    MCHC 35.0  30.0 - 36.0 g/dL    RDW 04.5  40.9 - 81.1 %    Platelets 196  150 - 400 K/uL   VITAMIN B12     Status: Normal   Collection Time   03/07/12  5:20 AM      Component Value Range Comment   Vitamin B-12 508  211 - 911 pg/mL   FOLATE     Status: Normal   Collection Time   03/07/12  5:20 AM      Component Value Range Comment   Folate 8.8     IRON AND TIBC     Status: Abnormal   Collection Time   03/07/12  5:20 AM      Component Value Range Comment   Iron 47  42 - 135 ug/dL    TIBC 914 (*) 782 - 956 ug/dL    Saturation Ratios 23  20 - 55 %    UIBC 155  125 - 400 ug/dL   FERRITIN     Status: Abnormal   Collection Time   03/07/12  5:20 AM      Component Value Range Comment   Ferritin 714 (*) 22 - 322 ng/mL     RETICULOCYTES     Status: Abnormal   Collection Time   03/07/12  5:20 AM      Component Value Range Comment   Retic Ct Pct 0.7  0.4 - 3.1 %    RBC. 3.95 (*) 4.22 - 5.81 MIL/uL    Retic Count, Manual 27.7  19.0 - 186.0 K/uL     No results found.  Review of Systems  Unable to perform ROS: language  All other systems reviewed and are negative.   Blood pressure 117/75, pulse 94, temperature 99.1 F (37.3 C), temperature source Oral, resp. rate 18, height 5\' 4"  (1.626 m), weight 60.7 kg (133 lb 13.1 oz), SpO2 95.00%. Physical Exam  Constitutional: He appears well-developed and well-nourished.  Eyes: Lids are normal. Right eye exhibits no chemosis, no discharge, no exudate and no hordeolum. No foreign body present in the right eye. Left eye exhibits exudate. Left eye exhibits no chemosis, no discharge and no hordeolum. No foreign body present in the left eye. Right conjunctiva is not injected. Right conjunctiva has no hemorrhage. Left conjunctiva is not injected. Left conjunctiva has no hemorrhage. No scleral icterus. Right pupil is reactive. Left pupil is reactive.  Fundoscopic exam:  The right eye shows exudate and hemorrhage. The right eye shows no papilledema.       The left eye shows exudate and hemorrhage. The left eye shows no papilledema.      Assessment/Plan: 1/ CMV Retinitis: Dose 2 of IV gancyiclovir. No significant clinical change since yesterday.  May need continued therapy for 10-14 days.  2. HIV status: initial report positive, pending confirmation. Being followed by ID.  Junko Ohagan B 03/07/2012, 4:36 PM

## 2012-03-07 NOTE — Progress Notes (Signed)
[facility] PROGRESS NOTE  Assessment/Plan: Focal retinitis and retinochoroiditis, peripheral (2012-03-29) - ? 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 Case manager consult for assistance with medication. - HIV +, checking viral load, genotype and CD 4 count, surely will be CD4 count will be <50.  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 - start prophylaxis, bactrim azithromycin. - consult ID for HAART therapy.  Anemia (2012-03-29): - Possible of chronic diesea. - check anemia panel.    Code Status: full Family Communication: wife  Disposition Plan: home   Consultants:  ID hatcher  Procedures:  none  Antibiotics:  Bactrim azithro 1.9.2014  HPI/Subjective: Still with blurry vision  Objective: Filed Vitals:   Mar 29, 2012 1354 03/29/12 1500 2012-03-29 2123 03/07/12 0549  BP: 121/66 117/74 118/73 111/84  Pulse: 82 91 98 98  Temp: 99.9 F (37.7 C) 99.3 F (37.4 C) 99.5 F (37.5 C) 98.8 F (37.1 C)  TempSrc: Oral Oral Oral Oral  Resp: 20 18 18 17   Height: 5\' 4"  (1.626 m)     Weight:  60.7 kg (133 lb 13.1 oz)    SpO2: 98% 99% 98% 99%    Intake/Output Summary (Last 24 hours) at 03/07/12 0806 Last data filed at 03-29-2012 1700  Gross per 24 hour  Intake    360 ml  Output      0 ml  Net    360 ml   Filed Weights   March 29, 2012 1500  Weight: 60.7 kg (133 lb 13.1 oz)    Exam:  General: Alert, awake, oriented x3, in no acute distress.  HEENT: No bruits, no goiter.  Heart: Regular rate and rhythm, without murmurs, rubs, gallops.  Lungs: Good air movement, clear to auscultation.  Abdomen: Soft, nontender, nondistended, positive bowel sounds.     Data Reviewed: Basic Metabolic Panel:  Lab 03-29-2012 4782  NA 136  K 4.2  CL 101  CO2 26  GLUCOSE 121*  BUN 12  CREATININE 0.57  CALCIUM 8.8  MG --  PHOS --   Liver Function Tests:  Lab 2012-03-29 1557  AST 22  ALT 20  ALKPHOS 79  BILITOT 0.2*  PROT 7.7    ALBUMIN 3.2*   No results found for this basename: LIPASE:5,AMYLASE:5 in the last 168 hours No results found for this basename: AMMONIA:5 in the last 168 hours CBC:  Lab 03/07/12 0520 Mar 29, 2012 1556  WBC 5.4 5.0  NEUTROABS -- 3.5  HGB 12.2* 11.7*  HCT 34.9* 33.6*  MCV 88.4 88.2  PLT 196 204   Cardiac Enzymes: No results found for this basename: CKTOTAL:5,CKMB:5,CKMBINDEX:5,TROPONINI:5 in the last 168 hours BNP (last 3 results) No results found for this basename: PROBNP:3 in the last 8760 hours CBG: No results found for this basename: GLUCAP:5 in the last 168 hours  No results found for this or any previous visit (from the past 240 hour(s)).   Studies: No results found.  Scheduled Meds:    . atropine  1 drop Both Eyes BID  . azithromycin  1,200 mg Oral Weekly  . enoxaparin (LOVENOX) injection  40 mg Subcutaneous Q24H  . ganciclovir (CYTOVENE) IV  5 mg/kg Intravenous Q12H  . prednisoLONE acetate  1 drop Both Eyes QID  . sulfamethoxazole-trimethoprim  1 tablet Oral Q12H   Continuous Infusions:    Marinda Elk  [facility] Pager [phone].  If 8PM-8AM, please contact night-coverage at [email or web address], password [facility] 03/07/2012, 8:06 AM  LOS: 1 day

## 2012-03-07 NOTE — Consult Note (Signed)
INFECTIOUS DISEASE CONSULT NOTE  Date of Admission:  03/06/2012  Date of Consult:  03/07/2012  Reason for Consult: AIDS Referring Physician: Robb Matar  Impression/Recommendation AIDS CMV retinitis  Would- plan to give him 14 days of IV ganciclovir.  Test his wife for HIV Would not give him valtrex, needs valgancyclovir if changed to PO.  Hold HIV therapy at this point.  Change his bactrim to qday Check RPR  Comment- some concern that starting on HIV therapy could cause paradoxical worsening of his CMV (IRIS) due to rapid improvement of immune function. If he improves on his retinal exam as done by ophtho, would consider shorter course of IV therapy (would not go less than 7 days). Would not base his dx on CMV serologies.    Thank you so much for this interesting consult,   [name] [phone]  AVWUJW Sherlon Handing Wynetta Emery is an 32 y.o. male.  HPI: 32 yo M born in [place], came to [place] 18 yrs ago, adm 1-8 with 3 weeks of L eye blurriness and itching. Over the 48 h prior to admission he developed the same sx in his R eye. He was seen by ophthalomology and started on atropine and prednisolone. Re-evaluated and sent to hospital for iniatation of rx for CMV retinitis. He was started on IV gancyclovir and we are asked to eval  Past Medical History  Diagnosis Date  . UTI (urinary tract infection)   . Retinitis     PERIFERAL FOCAL    Past Surgical History  Procedure Date  . No past surgeries      No Known Allergies  Medications:  Scheduled:   . atropine  1 drop Both Eyes BID  . azithromycin  1,200 mg Oral Weekly  . enoxaparin (LOVENOX) injection  40 mg Subcutaneous Q24H  . ganciclovir (CYTOVENE) IV  5 mg/kg Intravenous Q12H  . prednisoLONE acetate  1 drop Both Eyes QID  . sulfamethoxazole-trimethoprim  1 tablet Oral Q12H    Total days of antibiotics 1 (GCV)         Social History:  reports that he has been smoking Cigarettes.  He has a 3.75 pack-year smoking history. He has  never used smokeless tobacco. He reports that he drinks alcohol. He reports that he does not use illicit drugs. Married for last ~ 2 years.  History reviewed. No pertinent family history.  General ROS: See HPI. NO change in BM, or urine. no hx of STDs. no dysphagia.   Blood pressure 117/75, pulse 94, temperature 99.1 F (37.3 C), temperature source Oral, resp. rate 18, height 5\' 4"  (1.626 m), weight 60.7 kg (133 lb 13.1 oz), SpO2 95.00%. General appearance: alert, cooperative and no distress Eyes: pupils dilated, equal, EOMI.  Throat: lips, mucosa, and tongue normal; teeth and gums normal Lungs: clear to auscultation bilaterally Heart: regular rate and rhythm Abdomen: normal findings: bowel sounds normal and soft, non-tender Extremities: edema none   Results for orders placed during the hospital encounter of 03/06/12 (from the past 48 hour(s))  CBC WITH DIFFERENTIAL     Status: Abnormal   Collection Time   03/06/12  3:56 PM      Component Value Range Comment   WBC 5.0  4.0 - 10.5 K/uL    RBC 3.81 (*) 4.22 - 5.81 MIL/uL    Hemoglobin 11.7 (*) 13.0 - 17.0 g/dL    HCT 11.9 (*) 14.7 - 52.0 %    MCV 88.2  78.0 - 100.0 fL    MCH 30.7  26.0 - 34.0 pg    MCHC 34.8  30.0 - 36.0 g/dL    RDW 29.5  62.1 - 30.8 %    Platelets 204  150 - 400 K/uL    Neutrophils Relative 70  43 - 77 %    Neutro Abs 3.5  1.7 - 7.7 K/uL    Lymphocytes Relative 20  12 - 46 %    Lymphs Abs 1.0  0.7 - 4.0 K/uL    Monocytes Relative 8  3 - 12 %    Monocytes Absolute 0.4  0.1 - 1.0 K/uL    Eosinophils Relative 2  0 - 5 %    Eosinophils Absolute 0.1  0.0 - 0.7 K/uL    Basophils Relative 0  0 - 1 %    Basophils Absolute 0.0  0.0 - 0.1 K/uL   HIV ANTIBODY (ROUTINE TESTING)     Status: Abnormal   Collection Time   03/06/12  3:56 PM      Component Value Range Comment   HIV Reactive (*) NON REACTIVE   PROTIME-INR     Status: Normal   Collection Time   03/06/12  3:56 PM      Component Value Range Comment    Prothrombin Time 14.1  11.6 - 15.2 seconds    INR 1.10  0.00 - 1.49   COMPREHENSIVE METABOLIC PANEL     Status: Abnormal   Collection Time   03/06/12  3:57 PM      Component Value Range Comment   Sodium 136  135 - 145 mEq/L    Potassium 4.2  3.5 - 5.1 mEq/L    Chloride 101  96 - 112 mEq/L    CO2 26  19 - 32 mEq/L    Glucose, Bld 121 (*) 70 - 99 mg/dL    BUN 12  6 - 23 mg/dL    Creatinine, Ser 6.57  0.50 - 1.35 mg/dL    Calcium 8.8  8.4 - 84.6 mg/dL    Total Protein 7.7  6.0 - 8.3 g/dL    Albumin 3.2 (*) 3.5 - 5.2 g/dL    AST 22  0 - 37 U/L    ALT 20  0 - 53 U/L    Alkaline Phosphatase 79  39 - 117 U/L    Total Bilirubin 0.2 (*) 0.3 - 1.2 mg/dL    GFR calc non Af Amer >90  >90 mL/min    GFR calc Af Amer >90  >90 mL/min   CBC     Status: Abnormal   Collection Time   03/07/12  5:20 AM      Component Value Range Comment   WBC 5.4  4.0 - 10.5 K/uL    RBC 3.95 (*) 4.22 - 5.81 MIL/uL    Hemoglobin 12.2 (*) 13.0 - 17.0 g/dL    HCT 96.2 (*) 95.2 - 52.0 %    MCV 88.4  78.0 - 100.0 fL    MCH 30.9  26.0 - 34.0 pg    MCHC 35.0  30.0 - 36.0 g/dL    RDW 84.1  32.4 - 40.1 %    Platelets 196  150 - 400 K/uL   VITAMIN B12     Status: Normal   Collection Time   03/07/12  5:20 AM      Component Value Range Comment   Vitamin B-12 508  211 - 911 pg/mL   FOLATE     Status: Normal   Collection Time   03/07/12  5:20 AM      Component Value Range Comment   Folate 8.8     IRON AND TIBC     Status: Abnormal   Collection Time   03/07/12  5:20 AM      Component Value Range Comment   Iron 47  42 - 135 ug/dL    TIBC 657 (*) 846 - 962 ug/dL    Saturation Ratios 23  20 - 55 %    UIBC 155  125 - 400 ug/dL   FERRITIN     Status: Abnormal   Collection Time   03/07/12  5:20 AM      Component Value Range Comment   Ferritin 714 (*) 22 - 322 ng/mL   RETICULOCYTES     Status: Abnormal   Collection Time   03/07/12  5:20 AM      Component Value Range Comment   Retic Ct Pct 0.7  0.4 - 3.1 %    RBC. 3.95 (*) 4.22  - 5.81 MIL/uL    Retic Count, Manual 27.7  19.0 - 186.0 K/uL       Component Value Date/Time   SDES URINE, CATHETERIZED 09/13/2011 2225   SPECREQUEST NONE 09/13/2011 2225   CULT  Value: STAPHYLOCOCCUS SPECIES (COAGULASE NEGATIVE) Note: RIFAMPIN AND GENTAMICIN SHOULD NOT BE USED AS SINGLE DRUGS FOR TREATMENT OF STAPH INFECTIONS. 09/13/2011 2225   REPTSTATUS 09/16/2011 FINAL 09/13/2011 2225   No results found. No results found for this or any previous visit (from the past 240 hour(s)).    03/07/2012, 2:46 PM     LOS: 1 day

## 2012-03-08 LAB — HIV-1 RNA QUANT-NO REFLEX-BLD: HIV-1 RNA Quant, Log: 5.67 {Log} — ABNORMAL HIGH (ref <1.30)

## 2012-03-08 LAB — T-HELPER CELLS (CD4) COUNT (NOT AT ARMC): CD4 T Cell Abs: 20 uL — ABNORMAL LOW (ref 400–2700)

## 2012-03-08 MED ORDER — SODIUM CHLORIDE 0.9 % IV SOLN: 5.0000 mg/kg | Freq: Two times a day (BID) | INTRAVENOUS | Status: DC

## 2012-03-08 MED ORDER — SODIUM CHLORIDE 0.9 % IJ SOLN: 10.0000 mL | INTRAMUSCULAR | Status: DC | PRN

## 2012-03-08 MED ORDER — SODIUM CHLORIDE 0.9 % IV SOLN: 5.0000 mg/kg | Freq: Two times a day (BID) | INTRAVENOUS | Status: AC

## 2012-03-08 MED ORDER — AZITHROMYCIN 600 MG PO TABS: 1200.0000 mg | ORAL_TABLET | ORAL | Status: DC

## 2012-03-08 MED ORDER — SULFAMETHOXAZOLE-TMP DS 800-160 MG PO TABS: 1.0000 | ORAL_TABLET | Freq: Every day | ORAL | Status: DC

## 2012-03-08 NOTE — Discharge Summary (Signed)
Physician Discharge Summary  Curtis Burton ZOX:[id number] DOB: April 03, 1980 DOA: 2012-04-04  PCP: Default, Provider, MD  Admit date: Apr 04, 2012 Discharge date: 03/08/2012  Time spent: 35 minutes  Recommendations for Outpatient Follow-up:  1. Follow up with ophthalmologist 1 weeks 2. 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 Hispanic male who mainly speaks Spanish with no significant past medical history who presents with the above complaints. Most of the history was obtained from patient's close friend who is present in the room after getting consent from the patient to help translate. He reports that about 3 weeks ago he had itching and redness in his left eye which since then has become more blurry. Over the last 2 days he has had itching in his right eye with blurriness starting in his right eye. He had initially seen Dr. Mia Creek, with ophthalmology and has been prescribed atropine and prednisolone drops without any relief. Patient was seen by Dr. Allyne Gee, ophthalmology today given his decreased vision and given lack of insurance cannot afford oral valacyclovir, recommended admission for further care and management. Patient denies any recent fevers, chills, nausea, vomiting, chest pain, shortness of breath, abdominal pain, or diarrhea. He does complain of right-sided scalp itching. He also reports losing approximately 20 pounds unintentionally in the last 4-5 months   Hospital Course:  Focal retinitis and retinochoroiditis, peripheral (04-Apr-2012) - ? 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 Social worker consult for assistance with  medication.  - PICC line. 14 days as an outpatient. - HIV +, checking viral load, genotype and CD 4 count, pending - appreciate ophthalmologist assistance. To follow up as an outpatient.  - no change in fundoscopic exam, repeated on 1.9.2014  AIDS (acquired immunodeficiency syndrome), CD4 <=200 (04-04-2012) - viral load, genotype, CD4 count.  - start prophylaxis, bactrim azithromycin.  - consult ID for HAART, to follow up as an out patient.   Anemia (04-04-2012): - Possible of chronic diesea.  - check anemia panel.   Procedures:  none (i.e. Studies not automatically included, echos, thoracentesis, etc; not x-rays)  Consultations:  non  Discharge Exam: Filed Vitals:   03/07/12 2113 03/08/12 0527 03/08/12 0630 03/08/12 1345  BP: 117/59  93/70 114/73  Pulse: 79  81 73  Temp: 98.2 F (36.8 C)  99.1 F (37.3 C) 98.6 F (37 C)  TempSrc: Oral  Oral Oral  Resp: 18  16 16   Height:      Weight:  60.9 kg (134 lb 4.2 oz)    SpO2: 100%  99% 100%   See progress note Discharge Instructions  Discharge Orders    Future Appointments: Provider: Department: Dept Phone: Center:   03/26/2012 9:00 AM Judyann Munson, MD [facility] [phone] [facility]     Future Orders Please Complete By Expires   Diet - low sodium heart healthy      Increase activity slowly          Medication List     As of 03/08/2012  2:21 PM    TAKE these medications         azithromycin 600 MG tablet   Commonly known as: Christena Deem  Take 2 tablets (1,200 mg total) by mouth once a week.      sodium chloride 0.9 % SOLN 100 mL with ganciclovir 500 MG SOLR 305 mg   Inject 305 mg into the vein every 12 (twelve) hours.      sulfamethoxazole-trimethoprim 800-160 MG per tablet   Commonly known as: BACTRIM DS   Take 1 tablet by mouth daily.           Follow-up Information    Follow up with SANDERS,JASON B, MD. In 2 weeks. (hospital follow up)    Contact information:   [address] [place] [address] [phone]       Follow up with [name], MD. In 2 weeks. ([facility] con el Dr. Ninetta Lights en January 28 9:00 am)    Contact information:   [address]  [address] [place] [address] [phone]           The results of significant diagnostics from this hospitalization (including imaging, microbiology, ancillary and laboratory) are listed below for reference.    Significant Diagnostic Studies: No results found.  Microbiology: No results found for this or any previous visit (from the past 240 hour(s)).   Labs: Basic Metabolic Panel:  Lab 03/06/12 5284  NA 136  K 4.2  CL 101  CO2 26  GLUCOSE 121*  BUN 12  CREATININE 0.57  CALCIUM 8.8  MG --  PHOS --   Liver Function Tests:  Lab 03/06/12 1557  AST 22  ALT 20  ALKPHOS 79  BILITOT 0.2*  PROT 7.7  ALBUMIN 3.2*   No results found for this basename: LIPASE:5,AMYLASE:5 in the last 168 hours No results found for this basename: AMMONIA:5 in the last 168 hours CBC:  Lab 03/07/12 0520 03/06/12 1556  WBC 5.4 5.0  NEUTROABS -- 3.5  HGB 12.2* 11.7*  HCT 34.9* 33.6*  MCV 88.4 88.2  PLT 196 204   Cardiac Enzymes: No results found for this basename: CKTOTAL:5,CKMB:5,CKMBINDEX:5,TROPONINI:5 in the last 168 hours BNP: BNP (last 3 results) No results found for this basename: PROBNP:3 in the last 8760 hours CBG: No results found for this basename: GLUCAP:5 in the last 168 hours     Signed:  Marinda Elk  [facility] 03/08/2012, 2:21 PM

## 2012-03-08 NOTE — Progress Notes (Signed)
[facility] PROGRESS NOTE  Assessment/Plan: Focal retinitis and retinochoroiditis, peripheral (Mar 12, 2012) - ? 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 Social worker consult for assistance with medication. - PICC line. - HIV +, checking viral load, genotype and CD 4 count, surely will be CD4 count will be <50. - appreciate ophthalmologist assistance. To follow up as an outpatient. - no change in fundoscopic exam.  AIDS (acquired immunodeficiency syndrome), CD4 <=200 (March 12, 2012) - viral load, genotype, CD4 count. - start prophylaxis, bactrim azithromycin. - consult ID for HAART, to follow up as an out patient.  Anemia (March 12, 2012): - Possible of chronic diesea. - check anemia panel.    Code Status: full Family Communication: wife  Disposition Plan: home   Consultants:  ID hatcher  Procedures:  none  Antibiotics:  Bactrim azithro 1.9.2014  HPI/Subjective: Still with blurry vision, no change  Objective: Filed Vitals:   03/07/12 1417 03/07/12 2113 03/08/12 0527 03/08/12 0630  BP: 117/75 117/59  93/70  Pulse: 94 79  81  Temp: 99.1 F (37.3 C) 98.2 F (36.8 C)  99.1 F (37.3 C)  TempSrc: Oral Oral  Oral  Resp: 18 18  16   Height:      Weight:   60.9 kg (134 lb 4.2 oz)   SpO2: 95% 100%  99%    Intake/Output Summary (Last 24 hours) at 03/08/12 0954 Last data filed at 03/08/12 0500  Gross per 24 hour  Intake    960 ml  Output      0 ml  Net    960 ml   Filed Weights   12-Mar-2012 1500 03/08/12 0527  Weight: 60.7 kg (133 lb 13.1 oz) 60.9 kg (134 lb 4.2 oz)    Exam:  General: Alert, awake, oriented x3, in no acute distress.  HEENT: No bruits, no goiter.  Heart: Regular rate and rhythm, without murmurs, rubs, gallops.  Lungs: Good air movement, clear to auscultation.  Abdomen: Soft, nontender, nondistended, positive bowel sounds.     Data Reviewed: Basic Metabolic Panel:  Lab 2012/03/12 1610  NA 136  K 4.2  CL 101  CO2 26  GLUCOSE 121*  BUN 12  CREATININE  0.57  CALCIUM 8.8  MG --  PHOS --   Liver Function Tests:  Lab March 12, 2012 1557  AST 22  ALT 20  ALKPHOS 79  BILITOT 0.2*  PROT 7.7  ALBUMIN 3.2*   No results found for this basename: LIPASE:5,AMYLASE:5 in the last 168 hours No results found for this basename: AMMONIA:5 in the last 168 hours CBC:  Lab 03/07/12 0520 March 12, 2012 1556  WBC 5.4 5.0  NEUTROABS -- 3.5  HGB 12.2* 11.7*  HCT 34.9* 33.6*  MCV 88.4 88.2  PLT 196 204   Cardiac Enzymes: No results found for this basename: CKTOTAL:5,CKMB:5,CKMBINDEX:5,TROPONINI:5 in the last 168 hours BNP (last 3 results) No results found for this basename: PROBNP:3 in the last 8760 hours CBG: No results found for this basename: GLUCAP:5 in the last 168 hours  No results found for this or any previous visit (from the past 240 hour(s)).   Studies: No results found.  Scheduled Meds:    . atropine  1 drop Both Eyes BID  . azithromycin  1,200 mg Oral Weekly  . enoxaparin (LOVENOX) injection  40 mg Subcutaneous Q24H  . ganciclovir (CYTOVENE) IV  5 mg/kg Intravenous Q12H  . prednisoLONE acetate  1 drop Both Eyes QID  . sulfamethoxazole-trimethoprim  1 tablet Oral Daily   Continuous Infusions:    Marinda Elk  [facility] Pager [phone].  If 8PM-8AM, please contact night-coverage at [email or web address], password [facility] 03/08/2012, 9:54 AM  LOS: 2 days

## 2012-03-08 NOTE — Progress Notes (Addendum)
INFECTIOUS DISEASE PROGRESS NOTE  ID: Curtis Burton is a 32 y.o. male with   Principal Problem:  *Focal retinitis and retinochoroiditis, peripheral Active Problems:  AIDS (acquired immunodeficiency syndrome), CD4 <=200  Anemia  Subjective: Without complaints, vision unchanged.  Abtx:  Anti-infectives     Start     Dose/Rate Route Frequency Ordered Stop   03/08/12 1000  sulfamethoxazole-trimethoprim (BACTRIM DS) 800-160 MG per tablet 1 tablet       1 tablet Oral Daily 03/07/12 1520     03/08/12 0000   azithromycin (ZITHROMAX) 600 MG tablet  Status:  Discontinued        1,200 mg Oral Weekly 03/08/12 1416 03/08/12    03/08/12 0000   sodium chloride 0.9 % SOLN 100 mL with ganciclovir 500 MG SOLR 305 mg  Status:  Discontinued        5 mg/kg  60.7 kg 100 mL/hr over 60 Minutes Intravenous Every 12 hours 03/08/12 1416 03/08/12    03/08/12 0000   sulfamethoxazole-trimethoprim (BACTRIM DS) 800-160 MG per tablet  Status:  Discontinued        1 tablet Oral Daily 03/08/12 1416 03/08/12    03/08/12 0000   azithromycin (ZITHROMAX) 600 MG tablet        1,200 mg Oral Weekly 03/08/12 1417     03/08/12 0000  sulfamethoxazole-trimethoprim (BACTRIM DS) 800-160 MG per tablet       1 tablet Oral Daily 03/08/12 1417     03/08/12 0000   sodium chloride 0.9 % SOLN 100 mL with ganciclovir 500 MG SOLR 305 mg  Status:  Discontinued        5 mg/kg  60.7 kg 100 mL/hr over 60 Minutes Intravenous Every 12 hours 03/08/12 1419 03/08/12    03/08/12 0000   sodium chloride 0.9 % SOLN 100 mL with ganciclovir 500 MG SOLR 305 mg        5 mg/kg  60.7 kg 100 mL/hr over 60 Minutes Intravenous Every 12 hours 03/08/12 1420 03/22/12 2359   03/07/12 1000   sulfamethoxazole-trimethoprim (BACTRIM DS) 800-160 MG per tablet 1 tablet  Status:  Discontinued        1 tablet Oral Every 12 hours 03/07/12 0759 03/07/12 1520   03/07/12 0900   azithromycin (ZITHROMAX) tablet 1,200 mg        1,200 mg Oral Weekly  03/07/12 0759     03/06/12 2000   ganciclovir (CYTOVENE) 305 mg in sodium chloride 0.9 % 100 mL IVPB        5 mg/kg  60.7 kg 100 mL/hr over 60 Minutes Intravenous Every 12 hours 03/06/12 1658     03/06/12 1400   ganciclovir (CYTOVENE) 5 mg/kg in sodium chloride 0.9 % 100 mL IVPB  Status:  Discontinued        5 mg/kg 100 mL/hr over 60 Minutes Intravenous Every 12 hours 03/06/12 1350 03/06/12 1657          Medications:  Scheduled:   . atropine  1 drop Both Eyes BID  . azithromycin  1,200 mg Oral Weekly  . enoxaparin (LOVENOX) injection  40 mg Subcutaneous Q24H  . ganciclovir (CYTOVENE) IV  5 mg/kg Intravenous Q12H  . prednisoLONE acetate  1 drop Both Eyes QID  . sulfamethoxazole-trimethoprim  1 tablet Oral Daily    Objective: Vital signs in last 24 hours: Temp:  [98.2 F (36.8 C)-99.1 F (37.3 C)] 98.6 F (37 C) (01/10 1345) Pulse Rate:  [73-81] 73  (01/10 1345) Resp:  [16-18]  16  (01/10 1345) BP: (93-117)/(59-73) 114/73 mmHg (01/10 1345) SpO2:  [99 %-100 %] 100 % (01/10 1345) Weight:  [60.9 kg (134 lb 4.2 oz)] 60.9 kg (134 lb 4.2 oz) (01/10 0527)   General appearance: alert, cooperative and no distress Eyes: pupils +, dilated, eomi pt not in room  Lab Results  Basename 03/07/12 0520 03/06/12 1557 03/06/12 1556  WBC 5.4 -- 5.0  HGB 12.2* -- 11.7*  HCT 34.9* -- 33.6*  NA -- 136 --  K -- 4.2 --  CL -- 101 --  CO2 -- 26 --  BUN -- 12 --  CREATININE -- 0.57 --  GLU -- -- --   Liver Panel  Basename 03/06/12 1557  PROT 7.7  ALBUMIN 3.2*  AST 22  ALT 20  ALKPHOS 79  BILITOT 0.2*  BILIDIR --  IBILI --   Sedimentation Rate No results found for this basename: ESRSEDRATE in the last 72 hours C-Reactive Protein No results found for this basename: CRP:2 in the last 72 hours  Microbiology: No results found for this or any previous visit (from the past 240 hour(s)).  Studies/Results: No results found.   Assessment/Plan: AIDS (CD4 20) CMV  retinitis  Total days of antibiotics: Day 3 of gancyclovir       He is being d/c this afternoon. He will continue on IV gancyclovir for next 11 days.  Has ophtho f/u visit Has ID f/u scheduled on 1-28 (would like to see him back sooner, he will need PO Valcyte after he finishes his IV).  Explained to his wife that she needs to get HIV test.  continue his OI prophylaxis (bactrim, azithro) Will consider starting ART at his f/u visit.     [name] Infectious Diseases [phone] 03/08/2012, 3:26 PM   LOS: 2 days

## 2012-03-08 NOTE — Progress Notes (Signed)
[facility]  Patient Status: New  [facility] is providing the following services: RN and Home Infusion Services (teaching and education will be done by nurse in the home with patient and caregiver)  If patient discharges after hours, please call [phone].   Wynelle Bourgeois 03/08/2012, 10:58 AM

## 2012-03-08 NOTE — Progress Notes (Signed)
Peripherally Inserted Central Catheter/Midline Placement  The IV Nurse has discussed with the patient and/or persons authorized to consent for the patient, the purpose of this procedure and the potential benefits and risks involved with this procedure.  The benefits include less needle sticks, lab draws from the catheter and patient may be discharged home with the catheter.  Risks include, but not limited to, infection, bleeding, blood clot (thrombus formation), and puncture of an artery; nerve damage and irregular heat beat.  Alternatives to this procedure were also discussed.  PICC/Midline Placement Documentation        Curtis Burton 03/08/2012, 1:28 PM

## 2012-03-11 LAB — HEPATITIS PANEL, ACUTE
HCV Ab: NEGATIVE
Hep A IgM: NEGATIVE
Hepatitis B Surface Ag: NEGATIVE

## 2012-03-13 ENCOUNTER — Telehealth: Payer: Self-pay

## 2012-03-13 LAB — HIV 1/2 CONFIRMATION: HIV-2 Ab: NEGATIVE

## 2012-03-13 NOTE — Telephone Encounter (Signed)
DIS has  contacted the pateint.  He is ready to come for intake visit.  Appointment given for 03-20-11 @ 9:00 am they will inform the patient.   Laurell Josephs, RN

## 2012-03-19 ENCOUNTER — Ambulatory Visit: Payer: Self-pay

## 2012-03-20 ENCOUNTER — Encounter: Payer: Self-pay | Admitting: Infectious Disease

## 2012-03-20 ENCOUNTER — Other Ambulatory Visit: Payer: Self-pay | Admitting: Licensed Clinical Social Worker

## 2012-03-20 ENCOUNTER — Ambulatory Visit: Payer: Self-pay

## 2012-03-20 ENCOUNTER — Ambulatory Visit (INDEPENDENT_AMBULATORY_CARE_PROVIDER_SITE_OTHER): Payer: Self-pay | Admitting: Infectious Disease

## 2012-03-20 VITALS — BP 134/84 | HR 101 | Ht 63.0 in | Wt 145.0 lb

## 2012-03-20 DIAGNOSIS — B20 Human immunodeficiency virus [HIV] disease: Secondary | ICD-10-CM

## 2012-03-20 DIAGNOSIS — B259 Cytomegaloviral disease, unspecified: Secondary | ICD-10-CM

## 2012-03-20 DIAGNOSIS — Z9889 Other specified postprocedural states: Secondary | ICD-10-CM

## 2012-03-20 DIAGNOSIS — Z23 Encounter for immunization: Secondary | ICD-10-CM

## 2012-03-20 DIAGNOSIS — Z95828 Presence of other vascular implants and grafts: Secondary | ICD-10-CM

## 2012-03-20 DIAGNOSIS — H3090 Unspecified chorioretinal inflammation, unspecified eye: Secondary | ICD-10-CM

## 2012-03-20 MED ORDER — SULFAMETHOXAZOLE-TMP DS 800-160 MG PO TABS: 1.0000 | ORAL_TABLET | Freq: Every day | ORAL | Status: DC

## 2012-03-20 MED ORDER — VALGANCICLOVIR HCL 450 MG PO TABS: 900.0000 mg | ORAL_TABLET | Freq: Every day | ORAL | Status: DC

## 2012-03-20 MED ORDER — AZITHROMYCIN 600 MG PO TABS: 1200.0000 mg | ORAL_TABLET | ORAL | Status: DC

## 2012-03-20 MED ORDER — EFAVIRENZ-EMTRICITAB-TENOFOVIR 600-200-300 MG PO TABS: 1.0000 | ORAL_TABLET | Freq: Every day | ORAL | Status: DC

## 2012-03-20 MED ORDER — VALGANCICLOVIR HCL 450 MG PO TABS: 900.0000 mg | ORAL_TABLET | Freq: Two times a day (BID) | ORAL | Status: DC

## 2012-03-20 NOTE — Patient Instructions (Addendum)
WE WILL CONTINUE THE GANCYCLOVIR FOR ONE MORE WEEK THRU [facility]  I WILL CALL [facility] AT [place] TO GET YOU APPROVED URGENTLY FOR  ATRIPLA ONE TABLET ON EMPTY STOMACH AT NIGHT  YOU NEED TO KEEP TAKING THE  BACTRIM ONE TABLET DAILY  THE AZITHROMYCIN TWO TABLETS ONCE DAILY  WHEN YOU GET DONE WITH THE IV MEDICINE WE WILL START VALGANCYCLOVIR 450MG  TWO TABLETS ONCE DAILY  I WILL CALL DR. SANDERS TODAY AS WELL  RTC IN ONE WEEK

## 2012-03-20 NOTE — Progress Notes (Signed)
Subjective:    Patient ID: Curtis Burton, male    DOB: [birth year], 32 y.o.   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 He was diagnosed by Dr. Allyne Gee who arranged in patient admission. Patient has been treated with IV gancylclovir and has completed 14 days of therapy and now out of meds (provided by [facility]). He does not yet have ADAP enrollment or means of paying for meds but clearly needs antivirals for his CMV urgently and also needs to be started on an ARV regimen.  His vision has NOT improved since being on IV gancyclovir. His left eye is with very blurry vision. He claims he can see "casi nada." out of that eye, Right eye also with BV. He has been taking bactrim and also once weekly azithromycin. He did not receive flu shot in the hospital.  He is accompanied by his wife who I will test today in clinic via rapid test.   I spent greater than 60 minutes with the patient including greater than 50% of time in face to face counsel of the patient and in coordination of their care.    Review of Systems  Constitutional: Negative for fever, chills, diaphoresis, activity change, appetite change, fatigue and unexpected weight change.  HENT: Negative for congestion, sore throat, rhinorrhea, sneezing, trouble swallowing and sinus pressure.   Eyes: Positive for visual disturbance. Negative for photophobia.  Respiratory: Negative for cough, chest tightness, shortness of breath, wheezing and stridor.   Cardiovascular: Negative for chest pain, palpitations and leg swelling.  Gastrointestinal: Negative for nausea, vomiting, abdominal pain, diarrhea, constipation, blood in stool, abdominal distention and anal bleeding.  Genitourinary: Negative for dysuria, hematuria, flank pain and difficulty urinating.  Musculoskeletal: Negative for myalgias, back pain, joint swelling, arthralgias and gait problem.  Skin: Negative for color  change, pallor, rash and wound.  Neurological: Negative for dizziness, tremors, weakness and light-headedness.  Hematological: Negative for adenopathy. Does not bruise/bleed easily.  Psychiatric/Behavioral: Negative for behavioral problems, confusion, sleep disturbance, dysphoric mood, decreased concentration and agitation.       Objective:   Physical Exam  Constitutional: He is oriented to person, place, and time. He appears well-developed and well-nourished. No distress.  HENT:  Head: Normocephalic and atraumatic.  Mouth/Throat: Oropharynx is clear and moist. No oropharyngeal exudate.  Eyes: EOM are normal. Pupils are equal, round, and reactive to light. No scleral icterus.       His left eye has VERY blurry vision. With visual field confrontation he cannot tell difference between 1 and 3 fingers. Could discern difference between hand of 5 fingers.  Visual field on the right eye alone was blurry but can discern finger counts in all visual fields  Neck: Normal range of motion. Neck supple. No JVD present.  Cardiovascular: Normal rate, regular rhythm and normal heart sounds.  Exam reveals no gallop and no friction rub.   No murmur heard. Pulmonary/Chest: Effort normal and breath sounds normal. No respiratory distress. He has no wheezes. He has no rales. He exhibits no tenderness.  Abdominal: He exhibits no distension and no mass. There is no tenderness. There is no rebound and no guarding.  Musculoskeletal: He exhibits no edema and no tenderness.  Lymphadenopathy:    He has no cervical adenopathy.  Neurological: He is alert and oriented to person, place, and time. He exhibits normal muscle tone. Coordination normal.  Skin: Skin is warm and dry. He is not  diaphoretic. No erythema. No pallor.     Psychiatric: He has a normal mood and affect. His behavior is normal. Judgment and thought content normal.          Assessment & Plan:   CMV retinitis: We have called [facility] and they will  extend his IV gancyclovir twice daily for another week I will get him emergency ADAP and get him on valcyte 900mg  daily after he finishes his 21 days if he has good trajectory  In the meantime I have put in call to Dr. Allyne Gee to see if he mightnot need to be seen sooner by retina specialist given lack of improvement (though he has not worsened)  There will be some risk for IRIS which we hope to obviate by delay in ARV by two weeks already and by using drug less rapid at dropping Viral load *(atripla)  HIV: will pick atripla to drop viral load more gradually than integrase inhibitor or PI, continue bactrim and azithro for prophylaxis, Give flu shot I will test his wife today as well

## 2012-03-26 ENCOUNTER — Ambulatory Visit: Payer: Self-pay | Admitting: Internal Medicine

## 2012-03-27 ENCOUNTER — Encounter: Payer: Self-pay | Admitting: Infectious Disease

## 2012-03-27 ENCOUNTER — Ambulatory Visit: Payer: Self-pay

## 2012-03-27 ENCOUNTER — Ambulatory Visit (INDEPENDENT_AMBULATORY_CARE_PROVIDER_SITE_OTHER): Payer: Self-pay | Admitting: Infectious Disease

## 2012-03-27 VITALS — BP 112/78 | HR 94 | Temp 97.8°F | Wt 147.0 lb

## 2012-03-27 DIAGNOSIS — R209 Unspecified disturbances of skin sensation: Secondary | ICD-10-CM

## 2012-03-27 DIAGNOSIS — G4700 Insomnia, unspecified: Secondary | ICD-10-CM

## 2012-03-27 DIAGNOSIS — H30039 Focal chorioretinal inflammation, peripheral, unspecified eye: Secondary | ICD-10-CM

## 2012-03-27 DIAGNOSIS — B259 Cytomegaloviral disease, unspecified: Secondary | ICD-10-CM

## 2012-03-27 DIAGNOSIS — R202 Paresthesia of skin: Secondary | ICD-10-CM

## 2012-03-27 DIAGNOSIS — B20 Human immunodeficiency virus [HIV] disease: Secondary | ICD-10-CM

## 2012-03-27 DIAGNOSIS — H3090 Unspecified chorioretinal inflammation, unspecified eye: Secondary | ICD-10-CM

## 2012-03-27 MED ORDER — TRAZODONE HCL 50 MG PO TABS: 50.0000 mg | ORAL_TABLET | Freq: Every day | ORAL | Status: DC

## 2012-03-27 NOTE — Progress Notes (Signed)
  Subjective:    Patient ID: Curtis Burton, male    DOB: November 09, 1980, 32 y.o.   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 He was diagnosed by Dr. Allyne Gee who arranged in patient admission. Patient has been treated with IV gancylclovir which I extended thru today. He has ADAP approval and started ATripla. He has valcyte ready to start tonight. Vision has not improved to him subjectively but exam at Dr. Allyne Gee was improved per wife. The patient has suffered from vivid dreams, insominia and pruritis of this scalp since starting atripla. We reviewed current meds indications and alternate regimens and risk for IRIS with interpreter. I spent greater than 45 minutes with the patient including greater than 50% of time in face to face counsel of the patient and in coordination of their care.    Review of Systems  Constitutional: Positive for diaphoresis and fatigue. Negative for fever, chills, activity change, appetite change and unexpected weight change.  HENT: Negative for congestion, sore throat, rhinorrhea, sneezing, trouble swallowing and sinus pressure.   Eyes: Positive for visual disturbance. Negative for photophobia.  Respiratory: Negative for cough, chest tightness, shortness of breath, wheezing and stridor.   Cardiovascular: Negative for chest pain, palpitations and leg swelling.  Gastrointestinal: Negative for nausea, vomiting, abdominal pain, diarrhea, constipation, blood in stool, abdominal distention and anal bleeding.  Genitourinary: Negative for dysuria, hematuria, flank pain and difficulty urinating.  Musculoskeletal: Negative for myalgias, back pain, joint swelling, arthralgias and gait problem.  Skin: Negative for color change, pallor, rash and wound.  Neurological: Positive for numbness. Negative for dizziness, tremors, weakness and light-headedness.  Hematological: Negative for adenopathy. Does not  bruise/bleed easily.  Psychiatric/Behavioral: Negative for behavioral problems, confusion, sleep disturbance, dysphoric mood, decreased concentration and agitation.       Objective:   Physical Exam  Constitutional: He is oriented to person, place, and time. He appears well-developed and well-nourished. No distress.  HENT:  Head: Normocephalic and atraumatic.  Mouth/Throat: Oropharynx is clear and moist. No oropharyngeal exudate.  Eyes: Conjunctivae normal and EOM are normal. No scleral icterus.  Neck: Normal range of motion. Neck supple.  Cardiovascular: Normal rate and regular rhythm.   Pulmonary/Chest: Effort normal and breath sounds normal. No respiratory distress.  Abdominal: He exhibits no distension.  Musculoskeletal: He exhibits no edema and no tenderness.  Neurological: He is alert and oriented to person, place, and time. He exhibits normal muscle tone. Coordination normal.  Skin: Skin is warm and dry. He is not diaphoretic. No erythema. No pallor.  Psychiatric: His behavior is normal. Judgment and thought content normal. He exhibits a depressed mood.          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 Allyne Gee  Pruritis/l may be IRIS, use antihistamines  Insomnia/: try benadryl and if needed trazadone

## 2012-03-27 NOTE — Patient Instructions (Addendum)
He should come back for blood work in the 2nd week of February and  Make app for 9am on the 17th of February

## 2012-03-27 NOTE — Progress Notes (Signed)
RN received verbal order to discontinue the patient's PICC line.  Patient identified with name and date of birth. PICC dressing removed, site unremarkable.  PICC line removed using sterile procedure @ 1530. PICC length equal to that noted in patient's hospital chart of 37 cm. Sterile petroleum gauze + sterile 4X4 applied to PICC site, pressure applied for 10 minutes and covered with Medipore tape as a pressure dressing. Patient tolerated procedure without complaints.  Patient instructed to limit use of arm for 1 hour. Patient instructed that the pressure dressing should remain in place for 24 hours. Patient verbalized understanding of these instructions.

## 2012-03-30 ENCOUNTER — Encounter (HOSPITAL_COMMUNITY): Payer: Self-pay | Admitting: Emergency Medicine

## 2012-03-30 ENCOUNTER — Other Ambulatory Visit: Payer: Self-pay

## 2012-03-30 ENCOUNTER — Inpatient Hospital Stay (HOSPITAL_COMMUNITY)
Admission: EM | Admit: 2012-03-30 | Discharge: 2012-04-02 | DRG: 975 | Disposition: A | Discharge status: Home / Self Care | Payer: Medicaid Other | Attending: Infectious Disease | Admitting: Infectious Disease

## 2012-03-30 ENCOUNTER — Emergency Department (HOSPITAL_COMMUNITY): Payer: Medicaid Other

## 2012-03-30 DIAGNOSIS — I959 Hypotension, unspecified: Secondary | ICD-10-CM

## 2012-03-30 DIAGNOSIS — R000 Tachycardia, unspecified: Secondary | ICD-10-CM

## 2012-03-30 DIAGNOSIS — J189 Pneumonia, unspecified organism: Secondary | ICD-10-CM | POA: Diagnosis present

## 2012-03-30 DIAGNOSIS — D649 Anemia, unspecified: Secondary | ICD-10-CM | POA: Diagnosis present

## 2012-03-30 DIAGNOSIS — R509 Fever, unspecified: Secondary | ICD-10-CM | POA: Diagnosis present

## 2012-03-30 DIAGNOSIS — Z79899 Other long term (current) drug therapy: Secondary | ICD-10-CM

## 2012-03-30 DIAGNOSIS — B59 Pneumocystosis: Secondary | ICD-10-CM | POA: Diagnosis present

## 2012-03-30 DIAGNOSIS — A419 Sepsis, unspecified organism: Secondary | ICD-10-CM

## 2012-03-30 DIAGNOSIS — H3090 Unspecified chorioretinal inflammation, unspecified eye: Secondary | ICD-10-CM | POA: Diagnosis present

## 2012-03-30 DIAGNOSIS — B259 Cytomegaloviral disease, unspecified: Secondary | ICD-10-CM | POA: Diagnosis present

## 2012-03-30 DIAGNOSIS — F17200 Nicotine dependence, unspecified, uncomplicated: Secondary | ICD-10-CM | POA: Diagnosis present

## 2012-03-30 DIAGNOSIS — G4700 Insomnia, unspecified: Secondary | ICD-10-CM

## 2012-03-30 DIAGNOSIS — B20 Human immunodeficiency virus [HIV] disease: Principal | ICD-10-CM | POA: Diagnosis present

## 2012-03-30 LAB — LACTATE DEHYDROGENASE: LDH: 256 U/L — ABNORMAL HIGH (ref 94–250)

## 2012-03-30 LAB — CBC
HCT: 33 % — ABNORMAL LOW (ref 39.0–52.0)
HCT: 29.6 % — ABNORMAL LOW (ref 39.0–52.0)
Hemoglobin: 12 g/dL — ABNORMAL LOW (ref 13.0–17.0)
Hemoglobin: 10.7 g/dL — ABNORMAL LOW (ref 13.0–17.0)
RBC: 3.74 MIL/uL — ABNORMAL LOW (ref 4.22–5.81)
RBC: 3.38 MIL/uL — ABNORMAL LOW (ref 4.22–5.81)
RDW: 13.8 % (ref 11.5–15.5)
WBC: 6.9 10*3/uL (ref 4.0–10.5)

## 2012-03-30 LAB — POCT I-STAT, CHEM 8
BUN: 13 mg/dL (ref 6–23)
Chloride: 102 mEq/L (ref 96–112)
Potassium: 3.5 mEq/L (ref 3.5–5.1)
Sodium: 136 mEq/L (ref 135–145)

## 2012-03-30 LAB — BLOOD GAS, ARTERIAL
Acid-base deficit: 4 mmol/L — ABNORMAL HIGH (ref 0.0–2.0)
Bicarbonate: 19.2 mEq/L — ABNORMAL LOW (ref 20.0–24.0)
FIO2: 21 %
O2 Saturation: 98.4 %
Patient temperature: 98.6

## 2012-03-30 LAB — COMPREHENSIVE METABOLIC PANEL
AST: 59 U/L — ABNORMAL HIGH (ref 0–37)
Albumin: 3.1 g/dL — ABNORMAL LOW (ref 3.5–5.2)
Chloride: 98 mEq/L (ref 96–112)
Creatinine, Ser: 0.62 mg/dL (ref 0.50–1.35)
Total Bilirubin: 0.4 mg/dL (ref 0.3–1.2)

## 2012-03-30 LAB — LACTIC ACID, PLASMA
Lactic Acid, Venous: 2.3 mmol/L — ABNORMAL HIGH (ref 0.5–2.2)
Lactic Acid, Venous: 1 mmol/L (ref 0.5–2.2)

## 2012-03-30 LAB — URINALYSIS, ROUTINE W REFLEX MICROSCOPIC
Glucose, UA: NEGATIVE mg/dL
Hgb urine dipstick: NEGATIVE
Specific Gravity, Urine: 1.026 (ref 1.005–1.030)
Urobilinogen, UA: 1 mg/dL (ref 0.0–1.0)

## 2012-03-30 LAB — URINE MICROSCOPIC-ADD ON

## 2012-03-30 LAB — CREATININE, SERUM
Creatinine, Ser: 0.57 mg/dL (ref 0.50–1.35)
GFR calc non Af Amer: >90 mL/min (ref >90)

## 2012-03-30 LAB — TROPONIN I: Troponin I: <0.3 ng/mL (ref <0.30)

## 2012-03-30 MED ORDER — SODIUM CHLORIDE 0.9 % IJ SOLN
3.0000 mL | Freq: Two times a day (BID) | INTRAMUSCULAR | Status: DC
  Administered 2012-03-30 – 2012-04-01 (×5): 3 mL via INTRAVENOUS

## 2012-03-30 MED ORDER — SODIUM CHLORIDE 0.9 % IV BOLUS (SEPSIS)
1000.0000 mL | Freq: Once | INTRAVENOUS | Status: AC
  Administered 2012-03-30: 1000 mL via INTRAVENOUS

## 2012-03-30 MED ORDER — HEPARIN SODIUM (PORCINE) 5000 UNIT/ML IJ SOLN
5000.0000 [IU] | Freq: Three times a day (TID) | INTRAMUSCULAR | Status: DC
  Administered 2012-03-30 – 2012-04-02 (×9): 5000 [IU] via SUBCUTANEOUS
  Filled 2012-03-30 (×11): qty 1

## 2012-03-30 MED ORDER — VANCOMYCIN HCL IN DEXTROSE 1-5 GM/200ML-% IV SOLN: 1000.0000 mg | Freq: Three times a day (TID) | INTRAVENOUS | Status: DC

## 2012-03-30 MED ORDER — AZITHROMYCIN 600 MG PO TABS
1200.0000 mg | ORAL_TABLET | ORAL | Status: DC
  Administered 2012-03-30: 1200 mg via ORAL
  Filled 2012-03-30: qty 2

## 2012-03-30 MED ORDER — SODIUM CHLORIDE 0.9 % IV SOLN
INTRAVENOUS | Status: DC
  Administered 2012-03-30 – 2012-03-31 (×4): via INTRAVENOUS
  Administered 2012-04-01 – 2012-04-02 (×3): 1000 mL via INTRAVENOUS

## 2012-03-30 MED ORDER — TRAZODONE HCL 50 MG PO TABS
50.0000 mg | ORAL_TABLET | Freq: Every day | ORAL | Status: DC
  Administered 2012-03-30 – 2012-04-01 (×3): 50 mg via ORAL
  Filled 2012-03-30 (×4): qty 1

## 2012-03-30 MED ORDER — EFAVIRENZ-EMTRICITAB-TENOFOVIR 600-200-300 MG PO TABS
1.0000 | ORAL_TABLET | Freq: Every day | ORAL | Status: DC
  Administered 2012-03-30 – 2012-04-01 (×3): 1 via ORAL
  Filled 2012-03-30 (×4): qty 1

## 2012-03-30 MED ORDER — VANCOMYCIN HCL IN DEXTROSE 1-5 GM/200ML-% IV SOLN
1000.0000 mg | Freq: Three times a day (TID) | INTRAVENOUS | Status: DC
  Administered 2012-03-31: 1000 mg via INTRAVENOUS
  Filled 2012-03-30 (×3): qty 200

## 2012-03-30 MED ORDER — SULFAMETHOXAZOLE-TRIMETHOPRIM 400-80 MG/5ML IV SOLN
320.0000 mg | Freq: Three times a day (TID) | INTRAVENOUS | Status: DC
  Administered 2012-03-31 – 2012-04-01 (×4): 320 mg via INTRAVENOUS
  Filled 2012-03-30 (×10): qty 20

## 2012-03-30 MED ORDER — DEXTROSE 5 % IV SOLN
1.0000 g | Freq: Three times a day (TID) | INTRAVENOUS | Status: DC
  Administered 2012-03-31 – 2012-04-02 (×8): 1 g via INTRAVENOUS
  Filled 2012-03-30 (×11): qty 1

## 2012-03-30 MED ORDER — SULFAMETHOXAZOLE-TRIMETHOPRIM 400-80 MG/5ML IV SOLN
5.0000 mg/kg | Freq: Once | INTRAVENOUS | Status: AC
  Administered 2012-03-30: 329.6 mg via INTRAVENOUS
  Filled 2012-03-30: qty 20.6

## 2012-03-30 MED ORDER — VALGANCICLOVIR HCL 450 MG PO TABS
900.0000 mg | ORAL_TABLET | Freq: Every day | ORAL | Status: DC
  Administered 2012-03-31 – 2012-04-02 (×3): 900 mg via ORAL
  Filled 2012-03-30 (×3): qty 2

## 2012-03-30 NOTE — ED Notes (Signed)
Pt transported to radiology.

## 2012-03-30 NOTE — ED Notes (Signed)
Admitting MD at bedside.

## 2012-03-30 NOTE — Progress Notes (Signed)
ANTIBIOTIC CONSULT NOTE - INITIAL  Pharmacy Consult for Vancomycin Indication: suspected pneumonia (for 8 days)  No Known Allergies  Patient Measurements: Height: 5\' 3"  (160 cm) Weight: 145 lb (65.772 kg) IBW/kg (Calculated) : 56.9   Vital Signs: Temp: 99 F (37.2 C) (02/01 1708) Temp src: Oral (02/01 1708) BP: 103/59 mmHg (02/01 1818) Pulse Rate: 139  (02/01 1820) Intake/Output from previous day:   Intake/Output from this shift:    Labs:  Basename 03/30/12 1809 03/30/12 1726  WBC -- 6.9  HGB 11.6* 12.0*  PLT -- 181  LABCREA -- --  CREATININE 0.70 0.62   Estimated Creatinine Clearance: 105.7 ml/min (by C-G formula based on Cr of 0.7). No results found for this basename: VANCOTROUGH:2,VANCOPEAK:2,VANCORANDOM:2,GENTTROUGH:2,GENTPEAK:2,GENTRANDOM:2,TOBRATROUGH:2,TOBRAPEAK:2,TOBRARND:2,AMIKACINPEAK:2,AMIKACINTROU:2,AMIKACIN:2, in the last 72 hours   Microbiology: No results found for this or any previous visit (from the past 720 hour(s)).  Medical History: Past Medical History  Diagnosis Date  . UTI (urinary tract infection)   . Retinitis     PERIFERAL FOCAL    Assessment: 32 year old [place] with newly diagnosed HIV, AIDS and CMV retinitis being treated with valcyte, presented with worsening cough. Chest X-ray showed basilar airspace disease is worrisome for pneumonia, pharmacy is consulted to dose vancomycin. Pt. Is also started with cefepime 1g Q 8hrs. Scr 0.7, est. crcl > 173ml/min. Noted pt. Was also on Atripla and azithromycin and bactrim for OI prophylaxis prior to admission.     Goal of Therapy:  Vancomycin trough level 15-20 mcg/ml  Plan:  - Vancomycin 1g IV q 8 hrs - f/u renal function and clinical course - vancomycin trough after 3-5 days - f/u start home meds  Bayard Hugger, PharmD, BCPS  Clinical Pharmacist  Pager: [phone]  03/30/2012,6:44 PM

## 2012-03-30 NOTE — ED Notes (Signed)
Pt maintained SpO2 saturation 98% while ambulating with HR of 115.

## 2012-03-30 NOTE — H&P (Signed)
Hospital Admission Note Date: 03/30/2012  Patient name: Curtis Burton Medical record number: [id number] Date of birth: 1981/02/21 Age: 32 y.o. Gender: male PCP: Default, Provider, MD  Medical Service: Internal medicine teaching service  Attending physician: Dr Daiva Eves   1st Contact: Dr. Sherrine Maples      Pager:[phone] 2nd Contact: Dr. Manson Passey  [phone]  After 5 pm or weekends: 1st Contact: Pager: [phone] 2nd Contact: Pager: [phone]   Chief Complaint: Cough with chest pain for 3 days.  History of Present Illness: [name], Spanish speaking, with a medical history of HIV disease with CD4 count of 20, and a viral load of 460,000 on 03/07/2012, on Atripla for one month, CMV retinitis, recently completed 21 days of IV gancyclovir and currently on oral valcyte 900mg  daily. He presents with a dry cough with fevers and chills for 3 days.  He reports that his been well until 3 days ago, when he developed chills, and fevers associated with a dry cough, and chest pain. He grades his chest pain as 5/10 and increased with coughing. He describes his chest pain is sharp in nature on coughing. He does not have chest pains without coughing. He denies history of postnasal drip, sore throat, mucus production, or nasal congestion. He denies any recent history of sick contacts. He denies history of myalgia, joint pains, headaches, or neck stiffness. He has tried to treat his fevers at home with some Advil, but without any improvement. He did not check his temp at home. No history rash.  He also reports some shortness of breath, mainly on exertion.    On 03/20/2012, he was started on Atripla and he reports that he has been 100% compliant with his medications including Bactrim and azithromycin for prophylaxis. He follows with Dr Allyne Gee for his retinitis. He reports that his vision is better now even though he continues to have some burring in the left eye.   Meds: Current Outpatient Rx   Name  Route  Sig  Dispense  Refill  . AZITHROMYCIN 600 MG PO TABS   Oral   Take 2 tablets (1,200 mg total) by mouth once a week.   30 tablet   11     [id number] authorization [id number] new adap appr ...   . EFAVIRENZ-EMTRICITAB-TENOFOVIR 600-200-300 MG PO TABS   Oral   Take 1 tablet by mouth at bedtime.   30 tablet   11   . SULFAMETHOXAZOLE-TMP DS 800-160 MG PO TABS   Oral   Take 1 tablet by mouth daily.   30 tablet   11   . TRAZODONE HCL 50 MG PO TABS   Oral   Take 1 tablet (50 mg total) by mouth at bedtime.   30 tablet   11   . VALGANCICLOVIR HCL 450 MG PO TABS   Oral   Take 2 tablets (900 mg total) by mouth daily.   60 tablet   11     Allergies: Allergies as of 03/30/2012  . (No Known Allergies)   Past Medical History  Diagnosis Date  . UTI (urinary tract infection)   . Retinitis     PERIFERAL FOCAL   Past Surgical History  Procedure Date  . No past surgeries    No family history on file. History   Social History  . Marital Status: Single    Spouse Name: N/A    Number of Children: N/A  . Years of Education: N/A   Occupational History  . Not on  file.   Social History Main Topics  . Smoking status: Current Every Day Smoker -- 0.2 packs/day for 15 years    Types: Cigarettes  . Smokeless tobacco: Never Used  . Alcohol Use: No     Comment: Quit drinking 2 weeks ago.  . Drug Use: No  . Sexually Active: Yes     Comment: pt declined condoms   Other Topics Concern  . Not on file   Social History Narrative  . No narrative on file    Review of Systems: Constitutional: positive for anorexia, chills, fatigue, fevers and malaise Eyes: positive for visual disturbance and left eye with blurring but better with treatment.  Ears, nose, mouth, throat, and face: negative Respiratory: as noted in HPI Cardiovascular: negative for irregular heart beat, lower extremity edema, near-syncope, orthopnea, syncope and tachypnea Gastrointestinal: negative  for abdominal pain, change in bowel habits, constipation, diarrhea, dyspepsia, dysphagia and jaundice Genitourinary:negative Musculoskeletal:negative for arthralgias, back pain, bone pain, muscle weakness, myalgias, neck pain and stiff joints Neurological: negative for coordination problems, dizziness, headaches, memory problems, seizures, tremors and weakness Behavioral/Psych: negative Allergic/Immunologic: negative  Physical Exam: Blood pressure 103/59, pulse 139, temperature 99 F (37.2 C), temperature source Oral, resp. rate 30, height 5\' 3"  (1.6 m), weight 145 lb (65.772 kg), SpO2 95.00%. General appearance: alert, cooperative, fatigued, no distress and  wife Tobi Bastos at bedside and supplements the history.  Head: Normocephalic, without obvious abnormality, atraumatic Eyes: positive findings: Reduced vision in the left eye, but good vision in the right. Fundoscope (nondilated) unremarkable. Throat: lips, mucosa, and tongue normal; teeth and gums normal Neck: no adenopathy, no carotid bruit, no JVD, supple, symmetrical, trachea midline and thyroid not enlarged, symmetric, no tenderness/mass/nodules Back: symmetric, no curvature. ROM normal. No CVA tenderness. Lungs: clear to auscultation bilaterally and not in distress, lying comfortable in bed. Chest wall: no tenderness Heart: regular rate and rhythm, S1, S2 normal, no murmur, click, rub or gallop and tachycardia Abdomen: soft, non-tender; bowel sounds normal; no masses,  no organomegaly Extremities: extremities normal, atraumatic, no cyanosis or edema Pulses: 2+ and symmetric Lymph nodes: Cervical, supraclavicular, and axillary nodes normal. Neurologic: Alert and oriented X 3, normal strength and tone. Normal symmetric reflexes. Normal coordination and gait  Lab results: Basic Metabolic Panel:  Basename 03/30/12 1809 03/30/12 1726  NA 136 132*  K 3.5 3.5  CL 102 98  CO2 -- 20  GLUCOSE 176* 174*  BUN 13 14  CREATININE 0.70 0.62   CALCIUM -- 8.5  MG -- --  PHOS -- --   Liver Function Tests:  Basename 03/30/12 1726  AST 59*  ALT 107*  ALKPHOS 126*  BILITOT 0.4  PROT 7.6  ALBUMIN 3.1*   CBC:  Basename 03/30/12 1809 03/30/12 1726  WBC -- 6.9  NEUTROABS -- --  HGB 11.6* 12.0*  HCT 34.0* 33.0*  MCV -- 88.2  PLT -- 181   Cardiac Enzymes:  Basename 03/30/12 1726  CKTOTAL --  CKMB --  CKMBINDEX --  TROPONINI <0.30     Misc. Labs:   Imaging results:  Dg Chest 2 View  03/30/2012  *RADIOLOGY REPORT*  Clinical Data: Chest pain and cough.  Congestion.  CHEST - 2 VIEW  Comparison: CT of the abdomen and pelvis 09/14/2011.  Findings: Low lung volumes are present.  The heart size is normal. Mild bibasilar airspace disease is present.  There are air bronchograms at the left base, suggesting early infection.  IMPRESSION: Basilar airspace disease is worrisome for pneumonia.  Original Report Authenticated By: Marin Roberts, M.D.     Other results: EKG: normal EKG, normal sinus rhythm, nonspecific ST and T waves changes, sinus tachycardia, RAD.  Assessment & Plan by Problem: [name], Spanish speaking, with a past medical history of HIV disease with CD4 count of 20, and a viral load of 460,000 as of January 2014, on Atripla for one month, CMV retinitis, recently completed 21 days of IV gancyclovir and currently on oral valcyte 900mg  daily. He presents with a dry cough with fevers and chills for 3 days.  Healthcare associated Pneumonia: This patient with immune suppression from HIV disease, low CD4 count and a high viral load, presenting with cough, and fevers has a broad differential diagnosis for his symptoms. On physical exam he has good oxygen saturation and his lungs are clear to auscultation. However, his chest x-ray shows bibasilar airspace disease concerning for pneumonia. In January 2014 he was admitted in the hospital for 3 days for the treatment of CMV retinitis,  which makes this  HCAP. Other differential diagnoses include PCP (Pneumocystis carinii C. ), viral bronchitis, or even fungal infection in the setting of immunosuppression. In terms of PCP, he maintained good saturation on ambulation at 98% on room air. However, his LDH was elevated at 256 and a high lactic acid level of 2.3. In addition, he just recently started his antiretroviral therapy, which puts him at risk of IRIS. Even though he has a high heart rate, I do not suspect a PE in this patient without any other risk factors for thrombosis. Surprisingly, Valcyte is also  associated with a respiratory symptoms, including cough, and upper respiratory tract infection, and more than 10% of patients. We obtained urgent ABGs on room air, which revealed a pH of 7.46, PCO2 of 27.4, PO2 of 95.8, and a bicarbonate of 19.2, oxygen saturation of 98.4. Calculated A-a gradient of 21.37mmHg. He does not meet the  indication for Prednisone therapy with PCP (PO2 <14mmHg or A-a grad >35 mmHg). The respiratory alkalosis is most likely associated with hyperventilation. Plan. -Will admit the patient to step down unit. -Will shoot for broad coverage with cefepime and vancomycin. -Will continue with treatment with Bactrim for 21 days, as empiric coverage for PCP.  -Will perform a pneumocystis smear. -Consider serum 1-3 beta-d-glucan levels - No prednisone indicated at this time. -Blood cultures however, these were done after he had been given IV antibiotics. - Influenza PCR panel  - recheck lactic acid level.  Tachycardia and hypotension: He presented with high heart rates of 160-170. Possible causes of this patient include dehydration, especially given that his BP was low at 98/42. His blood pressure improved to 130/59 and his heart rate went down to 102 after 2L of IV fluids. Other possibilities in this patient include hypothyroidism, possibility of PE, as already mentioned above, and hypoxia due to his pneumonia. -Will continue with IV  fluids at 150 cc per hour. -Order TSH. -Monitor his vitals closely in the step down unit.  Elevated liver enzymes: Alkaline phosphatase of 126, albumin 3.1, AST of 59, an ALT of 107. Possible causes of this elevation of liver enzymes include Atripla, Bactrim, and HIV disease. His recent hepatitis panel was negative. He denies excessive use of Tylenol or alcohol. Plan. -IV fluids. -Will measure his liver enzymes. -Avoid hepatotoxic medications   HIV infection: Recent CD4 count of 20, and a viral load of 460,000 on 03/07/2012. He was cautiously started on Atripla to to gradually reduce  his viral load and to avoid IRIS. Plan. -Will continue with his Atripla at the moment. -Continue with Bactrim and azithromycin 1,200 mg weekly for prophylaxis  CMV retinitis: He recently completed 21 days of IV ganciclovir for the treatment of CMV retinitis. He follows closely with the ophthalmologist with Dr. Allyne Gee. His labs does of the sacrum. Her IV was on 03/27/2012. The PICC line was removed at that time. He continues with oral acyclovir 900 mg daily. He reports significant improvement in his vision even though he still has some blurring in the left eye. Plan  - continue with Val ganciclovir  Normocytic Anemia: His hemoglobin was 14.6 in July 2013. However, since January 2014 he has consistently demonstrated mild anemia in the ranges of 11-12. Anemia panel done on 09/04/2012 revealed total iron level of 47, total iron binding capacity of 202, and saturation of 23%. Vitamin B12 and folate were normal. Again, possible etiologies include HIV disease. Plan  - will monitor for now.    Insomnia: He takes present on at home. This will be continued. While inpatient.   Dispo: Disposition is deferred at this time, awaiting improvement of current medical problems. Anticipated discharge in approximately 2-3 day(s).   The patient does not have a current PCP (Default, Provider, MD), therefore will be requiring OPC  follow-up after discharge.   The patient does not have transportation limitations that hinder transportation to clinic appointments.  Signed: Dow Adolph 03/30/2012, 8:46 PM

## 2012-03-30 NOTE — ED Notes (Signed)
Family at bedside. 

## 2012-03-30 NOTE — ED Provider Notes (Signed)
History     CSN: [id number]  Arrival date & time 03/30/12  1655   First MD Initiated Contact with Patient 03/30/12 1712      Chief Complaint  Patient presents with  . Influenza  . 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 Onset 3 days ago. Worsening. Mild cough. Some right sided chest pain from coughing. None currently. Non-productive cough. CP non-radiating. No associated SOB. Not exertional. Dysuria today. H/o UTI. Patient is a 32 y.o. male presenting with general illness. The history is provided by the patient and a relative.  Illness  The current episode started 3 to 5 days ago. The onset was gradual. The problem occurs continuously. The problem has been gradually worsening. The problem is moderate. Nothing relieves the symptoms. Nothing aggravates the symptoms. Associated symptoms include a fever and cough. Pertinent negatives include no abdominal pain, no diarrhea, no nausea, no vomiting, no congestion, no headaches, no rhinorrhea, no neck stiffness, no rash and no eye pain.    Past Medical History  Diagnosis Date  . UTI (urinary tract infection)   . Retinitis     PERIFERAL FOCAL    Past Surgical History  Procedure Date  . No past surgeries     No family history on file.  History  Substance Use Topics  . Smoking status: Current Every Day Smoker -- 0.2 packs/day for 15 years    Types: Cigarettes  . Smokeless tobacco: Never Used  . Alcohol Use: No     Comment: Quit drinking 2 weeks ago.      Review of Systems  Constitutional: Positive for fever and chills.  HENT: Negative for congestion and rhinorrhea.   Eyes: Negative for pain and visual disturbance.  Respiratory: Positive for cough. Negative for shortness of breath.   Cardiovascular: Positive for chest pain (right sided). Negative for leg swelling.  Gastrointestinal: Negative  for nausea, vomiting, abdominal pain and diarrhea.  Genitourinary: Positive for dysuria (today). Negative for flank pain and difficulty urinating.  Musculoskeletal: Negative for back pain.  Skin: Negative for color change and rash.  Neurological: Negative for dizziness and headaches.  All other systems reviewed and are negative.    Allergies  Review of patient's allergies indicates no known allergies.  Home Medications   No current outpatient prescriptions on file.  BP 125/77  Pulse 112  Temp 99.8 F (37.7 C) (Oral)  Resp 23  Ht 5\' 3"  (1.6 m)  Wt 143 lb 15.4 oz (65.3 kg)  BMI 25.50 kg/m2  SpO2 97%  Physical Exam  Nursing note and vitals reviewed. Constitutional: He is oriented to person, place, and time. He appears well-developed and well-nourished. No distress.  HENT:  Head: Normocephalic and atraumatic.  Eyes: Conjunctivae normal are normal. Right eye exhibits no discharge. Left eye exhibits no discharge.  Neck: No tracheal deviation present.  Cardiovascular: Regular rhythm, normal heart sounds and intact distal pulses.        Sinus tachycardia  Pulmonary/Chest: Effort normal and breath sounds normal. No stridor. No respiratory distress. He has no wheezes. He has no rales.  Abdominal: Soft. He exhibits no distension. There is no tenderness. There is no guarding.  Musculoskeletal: He exhibits no edema and no tenderness.  Neurological: He is alert and oriented to person, place, and time.  Skin: Skin is warm and dry.  Psychiatric: He has a normal mood and affect. His behavior is normal.    ED  Course  Procedures (including critical care time)  Labs Reviewed  CBC - Abnormal; Notable for the following:    RBC 3.74 (*)     Hemoglobin 12.0 (*)     HCT 33.0 (*)     MCHC 36.4 (*)     All other components within normal limits  URINALYSIS, ROUTINE W REFLEX MICROSCOPIC - Abnormal; Notable for the following:    Protein, ur 30 (*)     All other components within normal  limits  LACTIC ACID, PLASMA - Abnormal; Notable for the following:    Lactic Acid, Venous 2.3 (*)     All other components within normal limits  COMPREHENSIVE METABOLIC PANEL - Abnormal; Notable for the following:    Sodium 132 (*)     Glucose, Bld 174 (*)     Albumin 3.1 (*)     AST 59 (*)     ALT 107 (*)     Alkaline Phosphatase 126 (*)     All other components within normal limits  POCT I-STAT, CHEM 8 - Abnormal; Notable for the following:    Glucose, Bld 176 (*)     Calcium, Ion 1.09 (*)     Hemoglobin 11.6 (*)     HCT 34.0 (*)     All other components within normal limits  LACTATE DEHYDROGENASE - Abnormal; Notable for the following:    LDH 256 (*)     All other components within normal limits  CBC - Abnormal; Notable for the following:    RBC 3.38 (*)     Hemoglobin 10.7 (*)     HCT 29.6 (*)     MCHC 36.1 (*)     All other components within normal limits  BLOOD GAS, ARTERIAL - Abnormal; Notable for the following:    pH, Arterial 7.460 (*)     pCO2 arterial 27.4 (*)     Bicarbonate 19.2 (*)     Acid-base deficit 4.0 (*)     All other components within normal limits  TROPONIN I  LACTIC ACID, PLASMA  URINE MICROSCOPIC-ADD ON  CREATININE, SERUM  CULTURE, BLOOD (ROUTINE X 2)  CULTURE, BLOOD (ROUTINE X 2)  INFLUENZA PANEL BY PCR  URINALYSIS, ROUTINE W REFLEX MICROSCOPIC  CULTURE, EXPECTORATED SPUTUM-ASSESSMENT  PNEUMOCYSTIS JIROVECI SMEAR BY DFA  TSH  COMPREHENSIVE METABOLIC PANEL  CBC  MRSA PCR SCREENING   Dg Chest 2 View  03/30/2012  *RADIOLOGY REPORT*  Clinical Data: Chest pain and cough.  Congestion.  CHEST - 2 VIEW  Comparison: CT of the abdomen and pelvis 09/14/2011.  Findings: Low lung volumes are present.  The heart size is normal. Mild bibasilar airspace disease is present.  There are air bronchograms at the left base, suggesting early infection.  IMPRESSION: Basilar airspace disease is worrisome for pneumonia.   Original Report Authenticated By: Marin Roberts, M.D.      1. Sepsis   2. HCAP (healthcare-associated pneumonia)   3. AIDS   4. Insomnia   5. AIDS (acquired immunodeficiency syndrome), CD4 <=200       MDM    32 y/o with AIDS male p/w cough and fever.  Tachycardic and hypotensive initially. Improved with IV fluids.  CXR concerning for pna. Labs and imaging as above. Given vanc, cefepime, and IV bactrim after cultures drawn. Discussed with ID. Admitted to medicine teaching service. Discussed steroids. Pending results of ABG.  Labs and imaging reviewed by myself and considered in medical decision making if ordered. Imaging interpreted by radiology.  Discussed case with Dr. Rubin Payor who is in agreement with assessment and plan.         Stevie Kern, MD 03/31/12 [phone]

## 2012-03-30 NOTE — ED Notes (Signed)
translate stated, he's had a cough and like the flu for 3 days.  Pt. Shaking at triage.

## 2012-03-30 NOTE — Progress Notes (Signed)
ANTIBIOTIC CONSULT NOTE - INITIAL  Pharmacy Consult for Bactrim Indication: r/o PCP PNA  No Known Allergies  Patient Measurements: Height: 5\' 3"  (160 cm) Weight: 145 lb (65.772 kg) IBW/kg (Calculated) : 56.9   Vital Signs: Temp: 98.3 F (36.8 C) (02/01 2232) Temp src: Oral (02/01 2232) BP: 118/68 mmHg (02/01 2232) Pulse Rate: 105  (02/01 2232)  Labs:  Basename 03/30/12 1809 03/30/12 1726  WBC -- 6.9  HGB 11.6* 12.0*  PLT -- 181  LABCREA -- --  CREATININE 0.70 0.62   Estimated Creatinine Clearance: 105.7 ml/min (by C-G formula based on Cr of 0.7).   Microbiology: No results found for this or any previous visit (from the past 720 hour(s)).  Medical History: Past Medical History  Diagnosis Date  . UTI (urinary tract infection)   . Retinitis     PERIFERAL FOCAL    Medications:  Prescriptions prior to admission  Medication Sig Dispense Refill  . azithromycin (ZITHROMAX) 600 MG tablet Take 2 tablets (1,200 mg total) by mouth once a week.  30 tablet  11  . efavirenz-emtricitabine-tenofovir (ATRIPLA) 600-200-300 MG per tablet Take 1 tablet by mouth at bedtime.  30 tablet  11  . sulfamethoxazole-trimethoprim (BACTRIM DS) 800-160 MG per tablet Take 1 tablet by mouth daily.  30 tablet  11  . traZODone (DESYREL) 50 MG tablet Take 1 tablet (50 mg total) by mouth at bedtime.  30 tablet  11  . valGANciclovir (VALCYTE) 450 MG tablet Take 2 tablets (900 mg total) by mouth daily.  60 tablet  11   Scheduled:    . azithromycin  1,200 mg Oral Weekly  . ceFEPime (MAXIPIME) IV  1 g Intravenous Q8H  . efavirenz-emtricitabine-tenofovir  1 tablet Oral QHS  . heparin  5,000 Units Subcutaneous Q8H  . [COMPLETED] sodium chloride  1,000 mL Intravenous Once  . [COMPLETED] sodium chloride  1,000 mL Intravenous Once  . sodium chloride  3 mL Intravenous Q12H  . [COMPLETED] sulfamethoxazole-trimethoprim  5 mg/kg Intravenous Once  . traZODone  50 mg Oral QHS  . valGANciclovir  900 mg Oral  Daily  . vancomycin  1,000 mg Intravenous Q8H  . [DISCONTINUED] vancomycin  1,000 mg Intravenous Q8H   Infusions:    . sodium chloride      Assessment: 31yo male c/o chills and fever associated with dry cough and CP, on Bactrim and azithro for Px, CXR suggests early infection; given low CD4 count and high viral load concern for PCP, to begin Bactrim.  Plan:  Rec'd one dose of Bactrim IV in ED; will continue with Bactrim 320mg  IV Q8H and monitor CBC and Cx.  Colleen Can PharmD BCPS 03/30/2012,11:29 PM

## 2012-03-31 ENCOUNTER — Inpatient Hospital Stay (HOSPITAL_COMMUNITY): Payer: Medicaid Other

## 2012-03-31 DIAGNOSIS — G4700 Insomnia, unspecified: Secondary | ICD-10-CM

## 2012-03-31 DIAGNOSIS — B59 Pneumocystosis: Secondary | ICD-10-CM | POA: Diagnosis present

## 2012-03-31 LAB — CBC
Hemoglobin: 10.5 g/dL — ABNORMAL LOW (ref 13.0–17.0)
MCH: 31.3 pg (ref 26.0–34.0)
MCHC: 35.8 g/dL (ref 30.0–36.0)
Platelets: 169 10*3/uL (ref 150–400)

## 2012-03-31 LAB — COMPREHENSIVE METABOLIC PANEL
ALT: 82 U/L — ABNORMAL HIGH (ref 0–53)
AST: 48 U/L — ABNORMAL HIGH (ref 0–37)
CO2: 17 mEq/L — ABNORMAL LOW (ref 19–32)
Chloride: 99 mEq/L (ref 96–112)
GFR calc non Af Amer: >90 mL/min (ref >90)
Potassium: 3.1 mEq/L — ABNORMAL LOW (ref 3.5–5.1)
Sodium: 128 mEq/L — ABNORMAL LOW (ref 135–145)
Total Bilirubin: 0.5 mg/dL (ref 0.3–1.2)

## 2012-03-31 LAB — STREP PNEUMONIAE URINARY ANTIGEN: Strep Pneumo Urinary Antigen: NEGATIVE

## 2012-03-31 LAB — INFLUENZA PANEL BY PCR (TYPE A & B): H1N1 flu by pcr: NOT DETECTED

## 2012-03-31 LAB — CRYPTOCOCCAL ANTIGEN

## 2012-03-31 LAB — CREATININE, URINE, RANDOM: Creatinine, Urine: 17.37 mg/dL

## 2012-03-31 LAB — TSH: TSH: 3.47 u[IU]/mL (ref 0.350–4.500)

## 2012-03-31 MED ORDER — PREDNISONE 20 MG PO TABS
40.0000 mg | ORAL_TABLET | Freq: Two times a day (BID) | ORAL | Status: DC
  Administered 2012-03-31 – 2012-04-02 (×5): 40 mg via ORAL
  Filled 2012-03-31 (×7): qty 2

## 2012-03-31 MED ORDER — POTASSIUM CHLORIDE CRYS ER 20 MEQ PO TBCR
40.0000 meq | EXTENDED_RELEASE_TABLET | Freq: Once | ORAL | Status: AC
  Administered 2012-03-31: 40 meq via ORAL
  Filled 2012-03-31: qty 2

## 2012-03-31 MED ORDER — ACETAMINOPHEN 325 MG PO TABS
650.0000 mg | ORAL_TABLET | Freq: Four times a day (QID) | ORAL | Status: DC | PRN
  Administered 2012-03-31: 650 mg via ORAL
  Filled 2012-03-31: qty 2

## 2012-03-31 MED ORDER — KETOROLAC TROMETHAMINE 10 MG PO TABS
10.0000 mg | ORAL_TABLET | Freq: Four times a day (QID) | ORAL | Status: DC | PRN
  Administered 2012-03-31: 10 mg via ORAL
  Filled 2012-03-31: qty 1

## 2012-03-31 MED ORDER — KETOROLAC TROMETHAMINE 10 MG PO TABS: 30.0000 mg | ORAL_TABLET | Freq: Four times a day (QID) | ORAL | Status: DC | PRN

## 2012-03-31 NOTE — Progress Notes (Signed)
Subjective: The patient was admitted overnight with fever, cough, and SOB, concerning for HCAP +/- PCP.  Vancomycin was discontinued this morning.  The patient notes that his symptoms are relatively unchanged since admission.  Objective: Vital signs in last 24 hours: Filed Vitals:   03/31/12 1000 03/31/12 1005 03/31/12 1100 03/31/12 1140  BP: 101/59  100/59   Pulse: 102  100 104  Temp:  100.6 F (38.1 C)  98.6 F (37 C)  TempSrc:  Oral  Oral  Resp: 24  23 22   Height:      Weight:      SpO2: 95%  95% 97%   Weight change:   Intake/Output Summary (Last 24 hours) at 03/31/12 1145 Last data filed at 03/31/12 1141  Gross per 24 hour  Intake   2030 ml  Output   1600 ml  Net    430 ml   PEX General: alert, cooperative, and in no apparent distress HEENT: pupils equal round and reactive to light, vision grossly intact, oropharynx clear and non-erythematous  Neck: supple, no lymphadenopathy Lungs: clear to ascultation bilaterally, normal work of respiration, no wheezes, rales, ronchi Heart: regular rate and rhythm, no murmurs, gallops, or rubs Abdomen: soft, non-tender, non-distended, normal bowel sounds Extremities: no cyanosis, clubbing, or edema Neurologic: alert & oriented X3, cranial nerves II-XII intact, strength grossly intact, sensation intact to light touch  Lab Results: Basic Metabolic Panel:  Lab 03/31/12 1610 03/30/12 2250 03/30/12 1809 03/30/12 1726  NA 128* -- 136 --  K 3.1* -- 3.5 --  CL 99 -- 102 --  CO2 17* -- -- 20  GLUCOSE 113* -- 176* --  BUN 9 -- 13 --  CREATININE 0.62 0.57 -- --  CALCIUM 7.8* -- -- 8.5  MG -- -- -- --  PHOS -- -- -- --   Liver Function Tests:  Lab 03/31/12 0435 03/30/12 1726  AST 48* 59*  ALT 82* 107*  ALKPHOS 110 126*  BILITOT 0.5 0.4  PROT 6.4 7.6  ALBUMIN 2.6* 3.1*   CBC:  Lab 03/31/12 0435 03/30/12 2250  WBC 5.5 5.0  NEUTROABS -- --  HGB 10.5* 10.7*  HCT 29.3* 29.6*  MCV 87.5 87.6  PLT 169 165   Cardiac  Enzymes:  Lab 03/30/12 1726  CKTOTAL --  CKMB --  CKMBINDEX --  TROPONINI <0.30   Urinalysis:  Lab 03/30/12 2143  COLORURINE YELLOW  LABSPEC 1.026  PHURINE 6.0  GLUCOSEU NEGATIVE  HGBUR NEGATIVE  BILIRUBINUR NEGATIVE  KETONESUR NEGATIVE  PROTEINUR 30*  UROBILINOGEN 1.0  NITRITE NEGATIVE  LEUKOCYTESUR NEGATIVE     Micro Results: Recent Results (from the past 240 hour(s))  MRSA PCR SCREENING     Status: Normal   Collection Time   03/30/12 11:20 PM      Component Value Range Status Comment   MRSA by PCR NEGATIVE  NEGATIVE Final    Studies/Results: Dg Chest 2 View  03/30/2012  *RADIOLOGY REPORT*  Clinical Data: Chest pain and cough.  Congestion.  CHEST - 2 VIEW  Comparison: CT of the abdomen and pelvis 09/14/2011.  Findings: Low lung volumes are present.  The heart size is normal. Mild bibasilar airspace disease is present.  There are air bronchograms at the left base, suggesting early infection.  IMPRESSION: Basilar airspace disease is worrisome for pneumonia.   Original Report Authenticated By: Marin Roberts, M.D.    Medications: I have reviewed the patient's current medications. Scheduled Meds:   . azithromycin  1,200 mg Oral Weekly  .  ceFEPime (MAXIPIME) IV  1 g Intravenous Q8H  . efavirenz-emtricitabine-tenofovir  1 tablet Oral QHS  . heparin  5,000 Units Subcutaneous Q8H  . sodium chloride  3 mL Intravenous Q12H  . sulfamethoxazole-trimethoprim  320 mg Intravenous Q8H  . traZODone  50 mg Oral QHS  . valGANciclovir  900 mg Oral Daily   Continuous Infusions:   . sodium chloride 150 mL/hr at 03/31/12 0724   PRN Meds:.acetaminophen Assessment/Plan: [name], Spanish speaking, with a past medical history of HIV disease with CD4 count of 20, and a viral load of 460,000 as of January 2014, on Atripla for one month, CMV retinitis, recently completed 21 days of IV gancyclovir and currently on oral valcyte 900mg  daily. He presents with a dry cough  with fevers and chills for 3 days.   Healthcare associated Pneumonia: The patient's cough, fever, and SOB, along with possible infiltrate on CXR, suggest HCAP with likely PCP.  Of note, LDH was elevated at 256 and lactic acid was 2.3. In addition, he just recently started his antiretroviral therapy, which puts him at risk of an IRIS component to his symptoms. PE is less likely (Wells score = 1.5), but could be a consideration if symptoms don't respond to treatment. Surprisingly, Valcyte is also associated with a respiratory symptoms, including cough, and upper respiratory tract infection, and more than 10% of patients. ABG shows an A-a gradient of 21.68mmHg.  -continue cefepime and bactrim for HCAP and PCP coverage -discontinue vancomycin today, since bactrim will cover for MRSA -adding prednisone today - 40 mg BID for 5 days, followed by 40 mg daily for 5 days, followed by 20 mg daily for 11 days (first dose 03/31/12) -Blood cultures pending, however these were done after he had been given IV antibiotics.  - Influenza PCR panel negative -checking strep pneumo, legionella, cryptococcal, blood AFB -if crypto antigen positive, patient will likely need LP -repeat CXR today  Tachycardia and hypotension: He presented with high heart rates of 160-170. Possible causes of this patient include dehydration, especially given that his BP was low at 98/42. His blood pressure improved to 130/59 and his heart rate went down to 102 after 2L of IV fluids. Other possibilities in this patient include hypothyroidism, possibility of PE, as already mentioned above, and hypoxia due to his pneumonia.  -Will continue with IV fluids at 150 cc per hour.  -TSH pending -Monitor his vitals closely in the step down unit.   Elevated liver enzymes: LFT's elevated on admission, now downtrending. Possible causes of this elevation of liver enzymes include mild shock liver, Atripla, Bactrim, or HIV disease. His recent hepatitis panel was  negative. He denies excessive use of Tylenol or alcohol. -continue to monitor -Avoid hepatotoxic medications   HIV infection: Recent CD4 count of 20, and a viral load of 460,000 on 03/07/2012. He was cautiously started on Atripla to to gradually reduce his viral load and to avoid IRIS.  -Will continue with his Atripla at the moment.  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 He follows closely with the ophthalmologist with Dr. Allyne Gee. His labs does of the sacrum. Her IV was on 03/27/2012. The PICC line was removed at that time. He continues with oral acyclovir 900 mg daily. He reports significant improvement in his vision even though he still has some blurring in the left eye.  - continue  with Valganciclovir   Normocytic Anemia: His hemoglobin was 14.6 in July 2013. However, since January 2014 he has consistently demonstrated mild anemia in the ranges of 11-12. Anemia panel done on 09/04/2012 revealed total iron level of 47, total iron binding capacity of 202, and saturation of 23%. Vitamin B12 and folate were normal. Again, possible etiologies include HIV disease.  - will monitor for now.   Insomnia:  -continue home trazodone   Dispo: Disposition is deferred at this time, awaiting improvement of current medical problems.  Anticipated discharge in approximately 3-4 day(s).   The patient does not have a current PCP (Default, Provider, MD), therefore will be requiring OPC follow-up after discharge.   The patient does not know have transportation limitations that hinder transportation to clinic appointments.  .Services Needed at time of discharge: Y = Yes, Blank = No PT:   OT:   RN:   Equipment:   Other:     LOS: 1 day   Janalyn Harder 03/31/2012, 11:45 AM

## 2012-03-31 NOTE — H&P (Signed)
Internal Medicine Teaching Service Attending Note Date: 03/31/2012  Patient name: Curtis Burton  Medical record number: [id number]  Date of birth: 1980/04/28    This patient has been seen and discussed with the house staff. Please see their note for complete details. I concur with their findings with the following additions/corrections:  Mr Curtis Burton is known from me to ID clinic where I have been closely following him for his newly diagnosed HIV/AIDS and CMV retinitis. I've seen him this past week at which point in time is complaining of some pruritus and numbness along his scalp but no headaches. I've given him prescriptions for Claritin and Benadryl to help with the symptoms. Since then however he has developed chest pain with a pleuritic component as well as high fevers apparently as high as 106 while he has been an inpatient. He came to the emergency department and was found to have bibasilar airspace disease concerning for possible pneumonia. He's been appropriately started on antibiotics for healthcare associated pneumonia given his recent hospitalization as well as trimethoprim sulfamethoxazole for possible PCP pneumonia. Of note his LDH was mildly elevated on admission.  PCP vs HCAP: --We will dc the vancomycin since we have coverage of MRSA with TMP/SMX --will continue cefepime --would check urine legionella ag and pneumococcal ag if not yet checked -I would repeat CXR today  I WOULD STRONGLY CONSIDER ADDING CORTICOSTEROIDS. He did not meet typical criteria for starting steroids for PCP, but given his high fevers after starting therapy and given the fact that there can be a massive amount of inflammation that can occur with PCP pneumonia and fact that this may be driving his fevers I think steroids would be helpful  HIGH FEVERS: See above re steroids. I also think we should check cryptococcal ag from serum and AFB blood cultures. Fevers could also be due to IRIS (and  hence steroids would help with this as well)  IF crypto ag is positive he will need LP with opening pressure documented csf crypto ag, csf cell coutn and glucose protein, and csf culture and CSF removed to drop opening pressure to below 20  CMV retinitis Improved eyesight in the right eye continue oral valganciclovir.  HIV: Recently started on Atripla approximately 2 weeks ago. I will check a viral load to see if it is rapidly declining which might inform the idea of possible immune reconstitution inflammatory syndrome.  I would like to keep the patient on my servcice as opposed to Dr Klima';s since I know the pt so well  [name] 03/31/2012, 10:53 AM

## 2012-03-31 NOTE — ED Provider Notes (Signed)
I saw and evaluated the patient, reviewed the resident's note and I agree with the findings and plan and agree with their ECG interpretation. Patient with cough and fever. Chest x-ray showed possible pneumonia. He is AIDS. He'll be admitted to medicine teaching service after discussion with infectious disease  Juliet Rude. Rubin Payor, MD 03/31/12 2355

## 2012-03-31 NOTE — Progress Notes (Signed)
Notified MD that patient's heart rate is sustaining 130's, sinus tachycardia, patient is asymptomatic.  Will continue to monitor

## 2012-03-31 NOTE — Progress Notes (Signed)
Dr made aware of high temparature, tepid sponging and ice packs given.Tylenol given per order.will continue to monitor

## 2012-04-01 DIAGNOSIS — R7989 Other specified abnormal findings of blood chemistry: Secondary | ICD-10-CM

## 2012-04-01 LAB — COMPREHENSIVE METABOLIC PANEL
ALT: 65 U/L — ABNORMAL HIGH (ref 0–53)
AST: 26 U/L (ref 0–37)
Albumin: 2.5 g/dL — ABNORMAL LOW (ref 3.5–5.2)
Alkaline Phosphatase: 105 U/L (ref 39–117)
BUN: 6 mg/dL (ref 6–23)
Chloride: 107 mEq/L (ref 96–112)
Potassium: 3.3 mEq/L — ABNORMAL LOW (ref 3.5–5.1)
Sodium: 134 mEq/L — ABNORMAL LOW (ref 135–145)
Total Bilirubin: 0.2 mg/dL — ABNORMAL LOW (ref 0.3–1.2)
Total Protein: 6.5 g/dL (ref 6.0–8.3)

## 2012-04-01 LAB — CBC WITH DIFFERENTIAL/PLATELET
Basophils Absolute: 0 10*3/uL (ref 0.0–0.1)
HCT: 26.9 % — ABNORMAL LOW (ref 39.0–52.0)
Hemoglobin: 9.7 g/dL — ABNORMAL LOW (ref 13.0–17.0)
Lymphocytes Relative: 17 % (ref 12–46)
Monocytes Absolute: 0.4 10*3/uL (ref 0.1–1.0)
Neutro Abs: 3.7 10*3/uL (ref 1.7–7.7)
RDW: 13.8 % (ref 11.5–15.5)
WBC: 5 10*3/uL (ref 4.0–10.5)

## 2012-04-01 LAB — LEGIONELLA ANTIGEN, URINE

## 2012-04-01 LAB — MAGNESIUM: Magnesium: 2.3 mg/dL (ref 1.5–2.5)

## 2012-04-01 MED ORDER — POTASSIUM CHLORIDE CRYS ER 20 MEQ PO TBCR
40.0000 meq | EXTENDED_RELEASE_TABLET | Freq: Once | ORAL | Status: AC
  Administered 2012-04-01: 40 meq via ORAL
  Filled 2012-04-01: qty 2

## 2012-04-01 MED ORDER — SULFAMETHOXAZOLE-TMP DS 800-160 MG PO TABS
2.0000 | ORAL_TABLET | Freq: Three times a day (TID) | ORAL | Status: DC
  Administered 2012-04-01 – 2012-04-02 (×3): 2 via ORAL
  Filled 2012-04-01 (×5): qty 2

## 2012-04-01 NOTE — Progress Notes (Signed)
Internal Medicine Teaching Service Attending Note Date: 04/01/2012  Patient name: Curtis Burton  Medical record number: [id number]  Date of birth: 1980/11/02    This patient has been seen and discussed with the house staff. Please see their note for complete details. I concur with their findings with the following additions/corrections:  Patient feels and looks much better today. I discussed his likely diagnosis of PCP pneumonia with patient and his wife and plan a course of therapy for this and for possible . HCAP. He appears stronger today his lungs are relatively clear except for some crackles at the bases.  He has been afebrile overnight after we started prednisone. His chest x-ray yesterday did look worse although this could be reflective of evolution of PCP pneumonia. Crypto ag serum negative and flu pcr negative  #1 Likely PCP: --change to Bactrim DS two tablets TID to complete total of 21 days --prednisone taper over 21 days, then back to daily TMP/SMX Finish 5 days of prednisone 40mg  bid Then  5 days 40 daily Then 11 days of 20 daily  I WOULD LIKE TO HAVE HIM AMBULATE WITH RN/PT AND CHECK AMBULATORY PULSE OX TODAY  #2 HCAP: covered for MRSA with TMP/SMX, pseudomonas with cefepime --continue both for now, consider change cefepime to levaquin to finish 5 days of gram negative coverage  #3 CMV stable on valcyte  #4 HIV: tolerating atripla less vivid dreams, I am checking VL more out of curiousity for poss of IRIS. He already has fu appt with me    Paulette Blanch Dam 04/01/2012, 11:26 AM

## 2012-04-01 NOTE — Progress Notes (Signed)
Subjective: [name] was seen and examined at bedside today.  He claims to be feeling much better since admission and wishes to go home.  He denies any chills, N/V/D, shortness of breath, chest pain, or abdominal pain at this time.  Tmax 106F yesterday.  Afebrile today.    Objective: Vital signs in last 24 hours: Filed Vitals:   04/01/12 0000 04/01/12 0428 04/01/12 0811 04/01/12 1200  BP: 95/57 95/61 128/71 105/62  Pulse: 85 90 58 95  Temp: 98.1 F (36.7 C) 97.5 F (36.4 C) 97.7 F (36.5 C) 97.8 F (36.6 C)  TempSrc:  Oral Oral Oral  Resp: 25 24 18 30   Height:      Weight:  145 lb 8.1 oz (66 kg)    SpO2: 92% 96% 97% 98%   Weight change: 8.1 oz (0.228 kg)  Intake/Output Summary (Last 24 hours) at 04/01/12 1449 Last data filed at 04/01/12 5409  Gross per 24 hour  Intake   4433 ml  Output   3950 ml  Net    483 ml   PEX General: alert, cooperative, and in no apparent distress HEENT: pupils equal round and reactive to light, vision grossly intact, oropharynx clear and non-erythematous  Neck: supple, no lymphadenopathy Lungs: clear to ascultation bilaterally, normal work of respiration, no wheezes, rales, ronchi Heart: regular rate and rhythm, no murmurs, gallops, or rubs Abdomen: soft, non-tender, non-distended, normal bowel sounds Extremities: no cyanosis, clubbing, or edema Neurologic: alert & oriented X3, cranial nerves II-XII intact, strength grossly intact, sensation intact to light touch  Lab Results: Basic Metabolic Panel:  Lab 04/01/12 8119 03/31/12 0435  NA 134* 128*  K 3.3* 3.1*  CL 107 99  CO2 19 17*  GLUCOSE 148* 113*  BUN 6 9  CREATININE 0.44* 0.62  CALCIUM 8.0* 7.8*  MG -- --  PHOS -- --   Liver Function Tests:  Lab 04/01/12 0525 03/31/12 0435  AST 26 48*  ALT 65* 82*  ALKPHOS 105 110  BILITOT 0.2* 0.5  PROT 6.5 6.4  ALBUMIN 2.5* 2.6*   CBC:  Lab 04/01/12 0525 03/31/12 0435  WBC 5.0 5.5  NEUTROABS 3.7 --  HGB 9.7* 10.5*  HCT 26.9*  29.3*  MCV 86.5 87.5  PLT 185 169   Cardiac Enzymes:  Lab 03/30/12 1726  CKTOTAL --  CKMB --  CKMBINDEX --  TROPONINI <0.30   Urinalysis:  Lab 03/30/12 2143  COLORURINE YELLOW  LABSPEC 1.026  PHURINE 6.0  GLUCOSEU NEGATIVE  HGBUR NEGATIVE  BILIRUBINUR NEGATIVE  KETONESUR NEGATIVE  PROTEINUR 30*  UROBILINOGEN 1.0  NITRITE NEGATIVE  LEUKOCYTESUR NEGATIVE   Micro Results: Recent Results (from the past 240 hour(s))  CULTURE, BLOOD (ROUTINE X 2)     Status: Normal (Preliminary result)   Collection Time   03/30/12  5:35 PM      Component Value Range Status Comment   Specimen Description BLOOD RIGHT ARM   Final    Special Requests BOTTLES DRAWN AEROBIC AND ANAEROBIC 5CC   Final    Culture  Setup Time 03/31/2012 03:50   Final    Culture     Final    Value:        BLOOD CULTURE RECEIVED NO GROWTH TO DATE CULTURE WILL BE HELD FOR 5 DAYS BEFORE ISSUING A FINAL NEGATIVE REPORT   Report Status PENDING   Incomplete   CULTURE, BLOOD (ROUTINE X 2)     Status: Normal (Preliminary result)   Collection Time   03/30/12  5:42 PM      Component Value Range Status Comment   Specimen Description BLOOD LEFT ARM   Final    Special Requests BOTTLES DRAWN AEROBIC AND ANAEROBIC 10CC   Final    Culture  Setup Time 03/31/2012 03:50   Final    Culture     Final    Value:        BLOOD CULTURE RECEIVED NO GROWTH TO DATE CULTURE WILL BE HELD FOR 5 DAYS BEFORE ISSUING A FINAL NEGATIVE REPORT   Report Status PENDING   Incomplete   MRSA PCR SCREENING     Status: Normal   Collection Time   03/30/12 11:20 PM      Component Value Range Status Comment   MRSA by PCR NEGATIVE  NEGATIVE Final   AFB CULTURE, BLOOD     Status: Normal (Preliminary result)   Collection Time   03/31/12  9:40 AM      Component Value Range Status Comment   Specimen Description BLOOD RIGHT HAND   Final    Special Requests BOTTLES DRAWN AEROBIC ONLY 5CC   Final    Culture     Final    Value: CULTURE WILL BE EXAMINED FOR 6 WEEKS  BEFORE ISSUING A FINAL REPORT   Report Status PENDING   Incomplete    Studies/Results: Dg Chest 2 View  03/31/2012  *RADIOLOGY REPORT*  Clinical Data: Fever, cough, shortness of breath  CHEST - 2 VIEW  Comparison: 03/30/2012; CT abdomen pelvis - 09/14/2011  Findings:  Grossly unchanged cardiac silhouette and mediastinal contours given reduced lung volumes.  Redemonstrated mild nodularity of the right hilum.  Worsening right basilar heterogeneous air space opacities. No definite pleural effusion or pneumothorax.  Unchanged bones.  IMPRESSION: 1.  Worsening right basilar airspace opacities compatible with infection.  2.  Mild nodularity of the right hilum, nonspecific but may be seen in the setting of adenopathy.   Original Report Authenticated By: Tacey Ruiz, MD    Dg Chest 2 View  03/30/2012  *RADIOLOGY REPORT*  Clinical Data: Chest pain and cough.  Congestion.  CHEST - 2 VIEW  Comparison: CT of the abdomen and pelvis 09/14/2011.  Findings: Low lung volumes are present.  The heart size is normal. Mild bibasilar airspace disease is present.  There are air bronchograms at the left base, suggesting early infection.  IMPRESSION: Basilar airspace disease is worrisome for pneumonia.   Original Report Authenticated By: Marin Roberts, M.D.    Medications: I have reviewed the patient's current medications. Scheduled Meds:    . azithromycin  1,200 mg Oral Weekly  . ceFEPime (MAXIPIME) IV  1 g Intravenous Q8H  . efavirenz-emtricitabine-tenofovir  1 tablet Oral QHS  . heparin  5,000 Units Subcutaneous Q8H  . predniSONE  40 mg Oral BID WC  . sodium chloride  3 mL Intravenous Q12H  . sulfamethoxazole-trimethoprim  2 tablet Oral TID  . traZODone  50 mg Oral QHS  . valGANciclovir  900 mg Oral Daily   Continuous Infusions:    . sodium chloride 1,000 mL (04/01/12 0909)   PRN Meds:.acetaminophen Assessment/Plan: [name], Spanish speaking, with a past medical history of HIV disease  with CD4 count of 20 and a viral load of 460,000 as of January 2014, on Atripla for one month, CMV retinitis, recently completed 21 days of IV gancyclovir and currently on oral valcyte 900mg  daily. He presents with a dry cough with fevers and chills for 3 days.   ?  PCP: The patient's cough, fever, and SOB, along with possible infiltrate on CXR are suggestive. ?HCAP as well.  LDH was elevated at 256 and lactic acid was 2.3. In addition, he just recently started his antiretroviral therapy, which puts him at risk of an IRIS component to his symptoms.  PE is less likely (Wells score = 1.5), but could be a consideration if symptoms don't respond to treatment. Valcyte is also associated with a respiratory symptoms, including cough, and upper respiratory tract infection, and more than 10% of patients.  ABG showed an A-a gradient of 21.33mmHg.  -continue cefepime and transition to PO bactrim ds for HCAP and PCP coverage; consider change of cefepime to levaquin tomorrow for 5 day coverage -continue prednisone with long taper: 40 mg BID for 2/5 days, followed by 40 mg daily for 5 days, followed by 20 mg daily for 11 days (first dose 03/31/12) -Blood cultures pending, however these were done after he had been given IV antibiotics.  - Influenza PCR panel negative -strep pneumo, legionella, cryptococcal all negative.  blood AFB pending - pneumocystis smear pending  Tachycardia and hypotension: Improving.  He presented with high heart rates of 160-170.  Possible causes of this patient include dehydration, especially given that his BP was low at 98/42. His blood pressure improved to 130/59 and his heart rate went down to 102 after 2L of IV fluids. Other possibilities in this patient include hypothyroidism but TSH 3.47, possibility of PE, as already mentioned above, and hypoxia due to his pneumonia.  -Will continue with IV fluids at 150 cc per hour.  -Monitor his vitals closely in the step down unit.   Elevated liver  enzymes: LFT's elevated on admission, now downtrending. Possible include mild shock liver, Atripla, Bactrim, or HIV disease. His recent hepatitis panel was negative. He denies excessive use of Tylenol or alcohol. -continue to monitor -Avoid hepatotoxic medications   HIV infection: Recent CD4 count of 20, and a viral load of 460,000 on 03/07/2012. He was cautiously started on Atripla to to gradually reduce his viral load and to avoid IRIS.  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 He follows closely with the ophthalmologist with Dr. Allyne Gee.  He continues with oral acyclovir 900 mg daily. He reports significant improvement in his vision even though he still has some blurring in the left eye.  - continue with Valganciclovir   Normocytic Anemia: His hemoglobin was 14.6 in July 2013. However, since January 2014 he has consistently demonstrated mild anemia in the ranges of 11-12. Anemia panel done on 09/04/2012 revealed total iron level of 47, total iron binding capacity of 202, and saturation of 23%. Vitamin B12 and folate were normal. Again, possible etiologies include HIV disease.  - will monitor for now.   Insomnia:  -continue home trazodone  Diet: regular DVT Ppx: heparin Dispo: Disposition is deferred at this time, awaiting improvement of current medical problems.  Anticipated discharge in approximately 1-2 day(s).   The patient does not have a current PCP (Default, Provider, MD), therefore will be requiring OPC follow-up after discharge.   The patient does not know have transportation limitations that hinder transportation to clinic appointments.  .Services Needed at time of discharge: Y = Yes, Blank = No PT:   OT:   RN:   Equipment:   Other:     LOS: 2 days   Virgina Organ,  Randalyn Ahmed 04/01/2012, 2:49 PM

## 2012-04-02 DIAGNOSIS — B59 Pneumocystosis: Secondary | ICD-10-CM

## 2012-04-02 DIAGNOSIS — B259 Cytomegaloviral disease, unspecified: Secondary | ICD-10-CM

## 2012-04-02 DIAGNOSIS — H3090 Unspecified chorioretinal inflammation, unspecified eye: Secondary | ICD-10-CM

## 2012-04-02 DIAGNOSIS — A419 Sepsis, unspecified organism: Secondary | ICD-10-CM

## 2012-04-02 LAB — CBC
HCT: 28.6 % — ABNORMAL LOW (ref 39.0–52.0)
Hemoglobin: 10.4 g/dL — ABNORMAL LOW (ref 13.0–17.0)
MCHC: 36.4 g/dL — ABNORMAL HIGH (ref 30.0–36.0)
RBC: 3.34 MIL/uL — ABNORMAL LOW (ref 4.22–5.81)
WBC: 3.3 10*3/uL — ABNORMAL LOW (ref 4.0–10.5)

## 2012-04-02 LAB — BASIC METABOLIC PANEL
BUN: 8 mg/dL (ref 6–23)
CO2: 17 mEq/L — ABNORMAL LOW (ref 19–32)
Chloride: 107 mEq/L (ref 96–112)
Glucose, Bld: 113 mg/dL — ABNORMAL HIGH (ref 70–99)
Potassium: 3.6 mEq/L (ref 3.5–5.1)

## 2012-04-02 MED ORDER — PREDNISONE 20 MG PO TABS: ORAL_TABLET | ORAL | Status: DC

## 2012-04-02 MED ORDER — LEVOFLOXACIN 750 MG PO TABS: 750.0000 mg | ORAL_TABLET | Freq: Every day | ORAL | Status: AC

## 2012-04-02 MED ORDER — SULFAMETHOXAZOLE-TMP DS 800-160 MG PO TABS: 2.0000 | ORAL_TABLET | Freq: Three times a day (TID) | ORAL | Status: DC

## 2012-04-02 MED ORDER — LEVOFLOXACIN 750 MG PO TABS
750.0000 mg | ORAL_TABLET | Freq: Every day | ORAL | Status: DC
  Administered 2012-04-02: 750 mg via ORAL
  Filled 2012-04-02: qty 1

## 2012-04-02 NOTE — Progress Notes (Addendum)
Subjective: [name] was seen and examined at bedside today.  He just finished ambulating with RN in the hallway and o2 sat on room air did not go below 95%.  He wishes to go home today.  He denies any chills, N/V/D, shortness of breath, chest pain, or abdominal pain at this time.  Afebrile for 24 hours.  Objective: Vital signs in last 24 hours: Filed Vitals:   04/02/12 0200 04/02/12 0400 04/02/12 0600 04/02/12 0732  BP: 102/60 117/67 104/67 106/61  Pulse: 78 65 80 70  Temp:  97.4 F (36.3 C)  97.2 F (36.2 C)  TempSrc:  Oral  Oral  Resp: 16 17 17 22   Height:      Weight:  141 lb 1.5 oz (64 kg)    SpO2: 97% 99% 92% 97%   Weight change: -4 lb 6.6 oz (-2 kg)  Intake/Output Summary (Last 24 hours) at 04/02/12 0843 Last data filed at 04/02/12 4098  Gross per 24 hour  Intake   3353 ml  Output   4050 ml  Net   -697 ml   PEX General: alert, cooperative, and in no apparent distress HEENT: pupils equal round and reactive to light, vision grossly intact, oropharynx clear and non-erythematous  Neck: supple, no lymphadenopathy Lungs: clear to ascultation bilaterally, normal work of respiration, no wheezes, rales, ronchi Heart: regular rate and rhythm, no murmurs, gallops, or rubs Abdomen: soft, non-tender, non-distended, normal bowel sounds Extremities: no cyanosis, clubbing, or edema Neurologic: alert & oriented X3, cranial nerves II-XII intact, strength grossly intact, sensation intact to light touch  Lab Results: Basic Metabolic Panel:  Lab 04/02/12 1191 04/01/12 0525  NA 136 134*  K 3.6 3.3*  CL 107 107  CO2 17* 19  GLUCOSE 113* 148*  BUN 8 6  CREATININE 0.45* 0.44*  CALCIUM 8.4 8.0*  MG 2.1 2.3  PHOS 2.8 --   Liver Function Tests:  Lab 04/01/12 0525 03/31/12 0435  AST 26 48*  ALT 65* 82*  ALKPHOS 105 110  BILITOT 0.2* 0.5  PROT 6.5 6.4  ALBUMIN 2.5* 2.6*   CBC:  Lab 04/02/12 0635 04/01/12 0525  WBC 3.3* 5.0  NEUTROABS -- 3.7  HGB 10.4* 9.7*  HCT 28.6*  26.9*  MCV 85.6 86.5  PLT 241 185   Cardiac Enzymes:  Lab 03/30/12 1726  CKTOTAL --  CKMB --  CKMBINDEX --  TROPONINI <0.30   Urinalysis:  Lab 03/30/12 2143  COLORURINE YELLOW  LABSPEC 1.026  PHURINE 6.0  GLUCOSEU NEGATIVE  HGBUR NEGATIVE  BILIRUBINUR NEGATIVE  KETONESUR NEGATIVE  PROTEINUR 30*  UROBILINOGEN 1.0  NITRITE NEGATIVE  LEUKOCYTESUR NEGATIVE   Micro Results: Recent Results (from the past 240 hour(s))  CULTURE, BLOOD (ROUTINE X 2)     Status: Normal (Preliminary result)   Collection Time   03/30/12  5:35 PM      Component Value Range Status Comment   Specimen Description BLOOD RIGHT ARM   Final    Special Requests BOTTLES DRAWN AEROBIC AND ANAEROBIC 5CC   Final    Culture  Setup Time 03/31/2012 03:50   Final    Culture     Final    Value:        BLOOD CULTURE RECEIVED NO GROWTH TO DATE CULTURE WILL BE HELD FOR 5 DAYS BEFORE ISSUING A FINAL NEGATIVE REPORT   Report Status PENDING   Incomplete   CULTURE, BLOOD (ROUTINE X 2)     Status: Normal (Preliminary result)   Collection Time  03/30/12  5:42 PM      Component Value Range Status Comment   Specimen Description BLOOD LEFT ARM   Final    Special Requests BOTTLES DRAWN AEROBIC AND ANAEROBIC 10CC   Final    Culture  Setup Time 03/31/2012 03:50   Final    Culture     Final    Value:        BLOOD CULTURE RECEIVED NO GROWTH TO DATE CULTURE WILL BE HELD FOR 5 DAYS BEFORE ISSUING A FINAL NEGATIVE REPORT   Report Status PENDING   Incomplete   MRSA PCR SCREENING     Status: Normal   Collection Time   03/30/12 11:20 PM      Component Value Range Status Comment   MRSA by PCR NEGATIVE  NEGATIVE Final   AFB CULTURE, BLOOD     Status: Normal (Preliminary result)   Collection Time   03/31/12  9:40 AM      Component Value Range Status Comment   Specimen Description BLOOD RIGHT HAND   Final    Special Requests BOTTLES DRAWN AEROBIC ONLY 5CC   Final    Culture     Final    Value: CULTURE WILL BE EXAMINED FOR 6 WEEKS  BEFORE ISSUING A FINAL REPORT   Report Status PENDING   Incomplete    Studies/Results: Dg Chest 2 View  03/31/2012  *RADIOLOGY REPORT*  Clinical Data: Fever, cough, shortness of breath  CHEST - 2 VIEW  Comparison: 03/30/2012; CT abdomen pelvis - 09/14/2011  Findings:  Grossly unchanged cardiac silhouette and mediastinal contours given reduced lung volumes.  Redemonstrated mild nodularity of the right hilum.  Worsening right basilar heterogeneous air space opacities. No definite pleural effusion or pneumothorax.  Unchanged bones.  IMPRESSION: 1.  Worsening right basilar airspace opacities compatible with infection.  2.  Mild nodularity of the right hilum, nonspecific but may be seen in the setting of adenopathy.   Original Report Authenticated By: Tacey Ruiz, MD    Medications: I have reviewed the patient's current medications. Scheduled Meds:    . azithromycin  1,200 mg Oral Weekly  . ceFEPime (MAXIPIME) IV  1 g Intravenous Q8H  . efavirenz-emtricitabine-tenofovir  1 tablet Oral QHS  . heparin  5,000 Units Subcutaneous Q8H  . predniSONE  40 mg Oral BID WC  . sodium chloride  3 mL Intravenous Q12H  . sulfamethoxazole-trimethoprim  2 tablet Oral TID  . traZODone  50 mg Oral QHS  . valGANciclovir  900 mg Oral Daily   Continuous Infusions:    . sodium chloride 1,000 mL (04/02/12 0621)   PRN Meds:.acetaminophen Assessment/Plan: [name], Spanish speaking, with a past medical history of HIV disease with CD4 count of 20 and a viral load of 460,000 as of January 2014, on Atripla for one month, CMV retinitis, recently completed 21 days of IV gancyclovir and currently on oral valcyte 900mg  daily. He presents with a dry cough with fevers and chills for 3 days.   ?PCP: The patient's cough, fever, and SOB, along with possible infiltrate on CXR are suggestive. ?HCAP as well.  LDH was elevated at 256 and lactic acid was 2.3. In addition, he just recently started his antiretroviral  therapy, which puts him at risk of an IRIS component to his symptoms.  PE is less likely (Wells score = 1.5), but could be a consideration if symptoms don't respond to treatment. Valcyte is also associated with a respiratory symptoms, including cough, and upper  respiratory tract infection, and more than 10% of patients.  ABG showed an A-a gradient of 21.70mmHg.  o2 sat on ambulation >95% on room air -d/c cefepime, start levaquin x2 more days, continue PO bactrim for 21 days total--day 4 today.    -continue prednisone with long taper: 40 mg BID for 3/5 days, followed by 40 mg daily for 5 days, followed by 20 mg daily for 11 days (first dose 03/31/12) -Blood cultures pending, however these were done after he had been given IV antibiotics.  - Influenza PCR panel negative -strep pneumo, legionella, cryptococcal all negative.  blood AFB pending - pneumocystis smear pending  Tachycardia and hypotension: Improving.  He presented with high heart rates of 160-170.  Possible causes of this patient include dehydration, especially given that his BP was low at 98/42. His blood pressure improved to 130/59 and his heart rate went down to 102 after 2L of IV fluids. Other possibilities in this patient include hypothyroidism but TSH 3.47, possibility of PE, as already mentioned above, and hypoxia due to his pneumonia.  -kvo -continue to monitor  Elevated liver enzymes: LFT's elevated on admission, now downtrending. Possible include mild shock liver, Atripla, Bactrim, or HIV disease. His recent hepatitis panel was negative. He denies excessive use of Tylenol or alcohol. -continue to monitor -Avoid hepatotoxic medications   HIV infection: Recent CD4 count of 20, and a viral load of 460,000 on 03/07/2012. He was cautiously started on Atripla to to gradually reduce his viral load and to avoid IRIS.  -Continue Atripla -Continue with Bactrim and azithromycin 1,200 mg weekly for prophylaxis   CMV retinitis: recently  completed 21 days of IV ganciclovir for the treatment of CMV retinitis. He follows closely with the ophthalmologist with Dr. Allyne Gee.  He continues with oral acyclovir 900 mg daily. He reports significant improvement in his vision even though he still has some blurring in the left eye.  - continue with Valganciclovir   Normocytic Anemia: His hemoglobin was 14.6 in July 2013. However, since January 2014 he has consistently demonstrated mild anemia in the ranges of 11-12. Anemia panel done on 09/04/2012 revealed total iron level of 47, total iron binding capacity of 202, and saturation of 23%. Vitamin B12 and folate were normal. Again, possible etiologies include HIV disease.  - will monitor for now.   Insomnia:  -continue home trazodone  Diet: regular DVT Ppx: heparin Dispo: d/c home today.   The patient does not have a current PCP (Default, Provider, MD), therefore will be requiring OPC follow-up after discharge.   The patient does not know have transportation limitations that hinder transportation to clinic appointments.  .Services Needed at time of discharge: Y = Yes, Blank = No PT:   OT:   RN:   Equipment:   Other:     LOS: 3 days   Darden Palmer 04/02/2012, 8:43 AM   Internal Medicine Teaching Service Attending Note Date: 04/02/2012  Patient name: Curtis Burton  Medical record number: [id number]  Date of birth: 1980/05/18    This patient has been seen and discussed with the house staff. Please see their note for complete details. I concur with their findings with the following additions/corrections:  Patient continues to improve. He did not appear to need home oxygen he will continue on high-dose Bactrim for PCP which is the most likely diagnosis. He also finished a five-day course of anti-pseudomonal antibiotics with levofloxacin. He'll continue his Valcyte for CMV retinitis. He seems to be tolerating the Atripla  better as well. [facility]  [name] 04/02/2012, 12:18 PM

## 2012-04-02 NOTE — Discharge Summary (Signed)
Internal Medicine Teaching [facility] Discharge Note  Name: Curtis Burton MRN: [id number] DOB: [birth year] 32 y.o.  Date of Admission: 03/30/2012  5:07 PM Date of Discharge: 04/02/2012 Attending Physician: Dr. Daiva Eves Discharge Diagnosis: Principal Problem:  *PCP (pneumocystis carinii pneumonia) Active Problems:  AIDS (acquired immunodeficiency syndrome), CD4 <=200  Anemia  HCAP (healthcare-associated pneumonia)  CMV retinitis  Discharge Medications:   Medication List     As of 04/02/2012  9:50 PM    TAKE these medications         azithromycin 600 MG tablet   Commonly known as: ZITHROMAX   Take 2 tablets (1,200 mg total) by mouth once a week.      efavirenz-emtricitabine-tenofovir 600-200-300 MG per tablet   Commonly known as: ATRIPLA   Take 1 tablet by mouth at bedtime.      levofloxacin 750 MG tablet   Commonly known as: LEVAQUIN   Take 1 tablet (750 mg total) by mouth daily.   Start taking on: 04/03/2012      predniSONE 20 MG tablet   Commonly known as: DELTASONE   40 mg po x1 tonight (2/4), take 40mg  PO BID until 04/04/12, take 40 mg qd x 5 days until 04/09/12 , take 20 mg daily x11 days until 04/20/12      sulfamethoxazole-trimethoprim 800-160 MG per tablet   Commonly known as: BACTRIM DS   Take 2 tablets by mouth 3 (three) times daily.      traZODone 50 MG tablet   Commonly known as: DESYREL   Take 1 tablet (50 mg total) by mouth at bedtime.      valGANciclovir 450 MG tablet   Commonly known as: VALCYTE   Take 2 tablets (900 mg total) by mouth daily.       Disposition and follow-up:   Curtis Burton was discharged from [facility] in stable condition.  At the hospital follow up visit please address: ?PCP vs HCAP--discharged on 21 days bactrim and completed 5 day course of levaquin with one more dose at discharge to be taken.  Labs pending AFB blood and blood cx x2.  Complete prednisone taper dose.   CMV  RETINITIS--continued on valcyte HIV--f/u lab visit in ID clinic and follow up  Follow-up Appointments:     Follow-up Information    Follow up with [name], MD. On 04/15/2012. (and 04/09/12 for labs)    Contact information:   [address] [place] [address] [phone]         Discharge Orders    Future Appointments: Provider: Department: Dept Phone: Center:   04/09/2012 10:15 AM [facility] [phone] [facility]   04/15/2012 10:00 AM Randall Hiss, MD [facility] [phone] [facility]     Future Orders Please Complete By Expires   Diet - low sodium heart healthy      Increase activity slowly      Call MD for:  temperature >100.4      Call MD for:  difficulty breathing, headache or visual disturbances      Call MD for:  persistant dizziness or light-headedness      Call MD for:  extreme fatigue        Procedures Performed:  Dg Chest 2 View  03/31/2012  *RADIOLOGY REPORT*  Clinical Data: Fever, cough, shortness of breath  CHEST - 2 VIEW  Comparison: 03/30/2012; CT abdomen pelvis -  09/14/2011  Findings:  Grossly unchanged cardiac silhouette and mediastinal contours given reduced lung volumes.  Redemonstrated mild nodularity of the right hilum.  Worsening right basilar heterogeneous air space opacities. No definite pleural effusion or pneumothorax.  Unchanged bones.  IMPRESSION: 1.  Worsening right basilar airspace opacities compatible with infection.  2.  Mild nodularity of the right hilum, nonspecific but may be seen in the setting of adenopathy.   Original Report Authenticated By: Tacey Ruiz, MD    Dg Chest 2 View  03/30/2012  *RADIOLOGY REPORT*  Clinical Data: Chest pain and cough.  Congestion.  CHEST - 2 VIEW  Comparison: CT of the abdomen and pelvis 09/14/2011.  Findings: Low lung volumes are present.  The heart size is normal. Mild bibasilar airspace  disease is present.  There are air bronchograms at the left base, suggesting early infection.  IMPRESSION: Basilar airspace disease is worrisome for pneumonia.   Original Report Authenticated By: Marin Roberts, M.D.    Admission HPI: [name], Spanish speaking, with a medical history of HIV disease with CD4 count of 20, and a viral load of 460,000 on 03/07/2012, on Atripla for one month, CMV retinitis, recently completed 21 days of IV gancyclovir and currently on oral valcyte 900mg  daily. He presents with a dry cough with fevers and chills for 3 days.  He reports that his been well until 3 days ago, when he developed chills, and fevers associated with a dry cough, and chest pain. He grades his chest pain as 5/10 and increased with coughing. He describes his chest pain is sharp in nature on coughing. He does not have chest pains without coughing. He denies history of postnasal drip, sore throat, mucus production, or nasal congestion. He denies any recent history of sick contacts. He denies history of myalgia, joint pains, headaches, or neck stiffness. He has tried to treat his fevers at home with some Advil, but without any improvement. He did not check his temp at home. No history rash. He also reports some shortness of breath, mainly on exertion.  On 03/20/2012, he was started on Atripla and he reports that he has been 100% compliant with his medications including Bactrim and azithromycin for prophylaxis. He follows with Dr Allyne Gee for his retinitis. He reports that his vision is better now even though he continues to have some burring in the left eye.   Hospital Course by problem list:  HCAP vs. PCP (pneumocystis carinii pneumonia)--presented with dry cough and fevers and chills.  These symptoms along with shortness of breath and cxr showing basilar airspace disease were suggestive of possible PCP vs.?HCAP.  LDH was elevated at 256 and lactic acid was 2.3.  In addition, he just recently  started his antiretroviral therapy, which puts him at risk of an IRIS component to his symptoms.  ABG showed an A-a gradient of 21.30mmHg. Tmax of 106F during hospital course resolved by time of discharge.  Started initially on vancomycin and cefepime but transitioned to Bactrim for 21 day course, levaquin x5 days, and continue azithromycin and valcyte.  Also started on steroids and to be long tapered on discharge.  Oxygen saturation well maintained on room air and with ambulation throughout hospital course.     Fever--tmax 106F during hospital course but resolved.  Maintained on antibiotics during admission, discharged on bactrim, levaquin and azithromycin and valcyte continuation.  Cryptococcal ag negative and blood and afb blood cx pending.  Fevers could also be secondary to IRIS and thus  steroids also would assist with this possability.     AIDS (acquired immunodeficiency syndrome), CD4 20 02/2012--recently started on ATRIPLA, and continued during hospital course. Follows in ID clinic with Dr. Daiva Eves and will do so as previously scheduled.     Anemia--Normocytic, with baseline Hb~11-12.  Hb monitored during hospital course.  Hb 10.4 on discharge.  F/u with pcp advised.     CMV retinitis--He recently completed 21 days of IV ganciclovir for the treatment of CMV retinitis and follows closely with the ophthalmologist with Dr. Allyne Gee.  He reports significant improvement in his vision even though he still has some blurring in the left eye.  Continued on valcyte during hospital course and on discharge.  F/u with opthalmology and ID clinic.    Discharge Vitals:  BP 121/81  Pulse 82  Temp 97.5 F (36.4 C) (Oral)  Resp 22  Ht 5\' 3"  (1.6 m)  Wt 141 lb 1.5 oz (64 kg)  BMI 24.99 kg/m2  SpO2 98%  Discharge Labs:  Results for orders placed during the hospital encounter of 03/30/12 (from the past 24 hour(s))  CBC     Status: Abnormal   Collection Time   04/02/12  6:35 AM      Component Value Range   WBC  3.3 (*) 4.0 - 10.5 K/uL   RBC 3.34 (*) 4.22 - 5.81 MIL/uL   Hemoglobin 10.4 (*) 13.0 - 17.0 g/dL   HCT 69.6 (*) 29.5 - 28.4 %   MCV 85.6  78.0 - 100.0 fL   MCH 31.1  26.0 - 34.0 pg   MCHC 36.4 (*) 30.0 - 36.0 g/dL   RDW 13.2  44.0 - 10.2 %   Platelets 241  150 - 400 K/uL  BASIC METABOLIC PANEL     Status: Abnormal   Collection Time   04/02/12  6:35 AM      Component Value Range   Sodium 136  135 - 145 mEq/L   Potassium 3.6  3.5 - 5.1 mEq/L   Chloride 107  96 - 112 mEq/L   CO2 17 (*) 19 - 32 mEq/L   Glucose, Bld 113 (*) 70 - 99 mg/dL   BUN 8  6 - 23 mg/dL   Creatinine, Ser 7.25 (*) 0.50 - 1.35 mg/dL   Calcium 8.4  8.4 - 36.6 mg/dL   GFR calc non Af Amer >90  >90 mL/min   GFR calc Af Amer >90  >90 mL/min  MAGNESIUM     Status: Normal   Collection Time   04/02/12  6:35 AM      Component Value Range   Magnesium 2.1  1.5 - 2.5 mg/dL  PHOSPHORUS     Status: Normal   Collection Time   04/02/12  6:35 AM      Component Value Range   Phosphorus 2.8  2.3 - 4.6 mg/dL   Signed: Darden Palmer 04/02/2012, 9:50 PM   Time Spent on Discharge: 35 minutes Services Ordered on Discharge: none Equipment Ordered on Discharge: none

## 2012-04-02 NOTE — Progress Notes (Signed)
Ambulated patient in hallway on room air with O2 sats between 95-97%. Denies of any discomfort.

## 2012-04-02 NOTE — Progress Notes (Signed)
Pharmacy note - Levofloxacin Received consult to start levofloxacin for pneumonia.  The patient's renal function is normal, so will give 750mg  po daily.  Thanks for the consult.  Pharmacy will sign off for now. Celedonio Miyamoto, PharmD, BCPS Clinical Pharmacist Pager [phone]

## 2012-04-04 NOTE — Discharge Summary (Signed)
Internal Medicine Teaching Service Attending Note Date: 04/04/2012  Patient name: Curtis Burton  Medical record number: [id number]  Date of birth: [birth year]    This patient has been seen and discussed with the house staff. Please see their note for complete details. I concur with their findings with the following additions/corrections:  Patient with likely PCP to finish 21 day course of bactrim and steroids, 5 day course of gram negative coverage. cotninue valcyte for CMV,  Continue his [place] And continue his azithro for M avium prophylaxis  He will fu with me in next two weeks  [name] 04/04/2012, 9:37 PM

## 2012-04-06 LAB — CULTURE, BLOOD (ROUTINE X 2)
Culture: NO GROWTH
Culture: NO GROWTH

## 2012-04-09 ENCOUNTER — Other Ambulatory Visit (INDEPENDENT_AMBULATORY_CARE_PROVIDER_SITE_OTHER): Payer: Self-pay

## 2012-04-09 ENCOUNTER — Other Ambulatory Visit: Payer: Self-pay | Admitting: Licensed Clinical Social Worker

## 2012-04-09 DIAGNOSIS — B20 Human immunodeficiency virus [HIV] disease: Secondary | ICD-10-CM

## 2012-04-10 LAB — CBC WITH DIFFERENTIAL/PLATELET
HCT: 36.2 % — ABNORMAL LOW (ref 39.0–52.0)
Hemoglobin: 12.6 g/dL — ABNORMAL LOW (ref 13.0–17.0)
Lymphocytes Relative: 19 % (ref 12–46)
Monocytes Absolute: 0.8 10*3/uL (ref 0.1–1.0)
Monocytes Relative: 8 % (ref 3–12)
Neutro Abs: 6.8 10*3/uL (ref 1.7–7.7)
Neutrophils Relative %: 73 % (ref 43–77)
RBC: 4.11 MIL/uL — ABNORMAL LOW (ref 4.22–5.81)
WBC: 9.4 10*3/uL (ref 4.0–10.5)

## 2012-04-10 LAB — COMPREHENSIVE METABOLIC PANEL
AST: 34 U/L (ref 0–37)
Albumin: 3.9 g/dL (ref 3.5–5.2)
BUN: 11 mg/dL (ref 6–23)
Calcium: 9.2 mg/dL (ref 8.4–10.5)
Chloride: 98 mEq/L (ref 96–112)
Glucose, Bld: 77 mg/dL (ref 70–99)
Potassium: 4.5 mEq/L (ref 3.5–5.3)
Sodium: 134 mEq/L — ABNORMAL LOW (ref 135–145)
Total Protein: 7.4 g/dL (ref 6.0–8.3)

## 2012-04-10 LAB — T-HELPER CELL (CD4) - (RCID CLINIC ONLY): CD4 T Cell Abs: 200 uL — ABNORMAL LOW (ref 400–2700)

## 2012-04-12 LAB — HIV-1 RNA QUANT-NO REFLEX-BLD: HIV-1 RNA Quant, Log: 2.96 {Log} — ABNORMAL HIGH (ref <1.30)

## 2012-04-15 ENCOUNTER — Ambulatory Visit (INDEPENDENT_AMBULATORY_CARE_PROVIDER_SITE_OTHER): Payer: Self-pay | Admitting: Infectious Disease

## 2012-04-15 ENCOUNTER — Encounter: Payer: Self-pay | Admitting: Infectious Disease

## 2012-04-15 VITALS — BP 120/77 | HR 99 | Temp 97.5°F | Wt 144.0 lb

## 2012-04-15 DIAGNOSIS — R509 Fever, unspecified: Secondary | ICD-10-CM

## 2012-04-15 DIAGNOSIS — H3090 Unspecified chorioretinal inflammation, unspecified eye: Secondary | ICD-10-CM

## 2012-04-15 DIAGNOSIS — B259 Cytomegaloviral disease, unspecified: Secondary | ICD-10-CM

## 2012-04-15 DIAGNOSIS — B59 Pneumocystosis: Secondary | ICD-10-CM

## 2012-04-15 DIAGNOSIS — Z23 Encounter for immunization: Secondary | ICD-10-CM

## 2012-04-15 DIAGNOSIS — R6510 Systemic inflammatory response syndrome (SIRS) of non-infectious origin without acute organ dysfunction: Secondary | ICD-10-CM

## 2012-04-15 DIAGNOSIS — D893 Immune reconstitution syndrome: Secondary | ICD-10-CM

## 2012-04-15 DIAGNOSIS — B370 Candidal stomatitis: Secondary | ICD-10-CM

## 2012-04-15 DIAGNOSIS — B20 Human immunodeficiency virus [HIV] disease: Secondary | ICD-10-CM

## 2012-04-15 MED ORDER — FLUCONAZOLE 100 MG PO TABS: 100.0000 mg | ORAL_TABLET | ORAL | Status: DC

## 2012-04-15 NOTE — Progress Notes (Signed)
Subjective:    Patient ID: Curtis Burton, male    DOB: 25-Apr-1980, 32 y.o.   MRN: [id number]  Fever  Pertinent negatives include no abdominal pain, chest pain, congestion, coughing, diarrhea, nausea, rash, sore throat, vomiting or wheezing.    32 year old Hispanic man with recently diagnosed HIV AIDS CMV retinitis and recent PCP pneumonia (latter dx made clinically) test was recent admission for above-mentioned pneumonia. He is currently finishing a 21 day course of Bactrim 2 tablets 3 times daily along with prednisone taper. He is on relatively well didn't have episode of fever this last weekend with some drenching sweats but then resolve. He been taking some antipyretics for this.  Count with him and his wife that should his fevers persist that he would need to be reevaluated by [place] in clinic. Today in clinic he appears to be doing relatively well. He is breathing comfortably his chest pain that he had previously with his pneumonia has resolved. He does told of thrush on his tongue which we will treat.  Review of Systems  Constitutional: Positive for fever and diaphoresis. Negative for chills, activity change, appetite change, fatigue and unexpected weight change.  HENT: Negative for congestion, sore throat, rhinorrhea, sneezing, trouble swallowing and sinus pressure.   Eyes: Negative for photophobia.  Respiratory: Negative for cough, chest tightness, shortness of breath, wheezing and stridor.   Cardiovascular: Negative for chest pain, palpitations and leg swelling.  Gastrointestinal: Negative for nausea, vomiting, abdominal pain, diarrhea, constipation, blood in stool, abdominal distention and anal bleeding.  Genitourinary: Negative for dysuria, hematuria, flank pain and difficulty urinating.  Musculoskeletal: Negative for myalgias, back pain, joint swelling, arthralgias and gait problem.  Skin: Negative for color change, pallor, rash and wound.  Neurological: Negative for  dizziness, tremors, weakness and light-headedness.  Hematological: Negative for adenopathy. Does not bruise/bleed easily.  Psychiatric/Behavioral: Negative for behavioral problems, confusion, sleep disturbance, dysphoric mood, decreased concentration and agitation.       Objective:   Physical Exam  Constitutional: He is oriented to person, place, and time. He appears well-developed and well-nourished. No distress.  HENT:  Head: Normocephalic and atraumatic.  Mouth/Throat: Oropharyngeal exudate present.  Thrush  Eyes: Conjunctivae and EOM are normal. Pupils are equal, round, and reactive to light. No scleral icterus.  Neck: Normal range of motion. Neck supple. No JVD present.  Cardiovascular: Normal rate, regular rhythm and normal heart sounds.  Exam reveals no gallop and no friction rub.   No murmur heard. Pulmonary/Chest: Effort normal and breath sounds normal. No respiratory distress. He has no wheezes. He has no rales. He exhibits no tenderness.  Abdominal: He exhibits no distension and no mass. There is no tenderness. There is no rebound and no guarding.  Musculoskeletal: He exhibits no edema and no tenderness.  Lymphadenopathy:    He has no cervical adenopathy.  Neurological: He is alert and oriented to person, place, and time. He exhibits normal muscle tone. Coordination normal.  Skin: Skin is warm and dry. He is not diaphoretic. No erythema. No pallor.  Psychiatric: He has a normal mood and affect. His behavior is normal. Judgment and thought content normal.          Assessment & Plan:  HIV: Continue Atripla viral load has come down nicely and CD4 count is now at 200.  CMV retinitis: Continue valganciclovir, continued to be followed closely by Dr. Allyne Gee. He is also on topical steroids, vision improving  PCP: Finish 21 day course of Bactrim with prednisone taper. Then  go to one tablet daily again of Bactrim for PCP prophylaxis.   M. avium prophylaxis: We'll continue  azithromycin for now but likely discontinue it next visit.  Thrush: Give a 14 day course of fluconazole.  Fevers: Suspect this could be more immune reconstitution given his viral suppression and elevation and CD4 count. We will monitor.

## 2012-04-23 ENCOUNTER — Encounter: Payer: Self-pay | Admitting: Infectious Diseases

## 2012-05-01 ENCOUNTER — Encounter: Payer: Self-pay | Admitting: Infectious Diseases

## 2012-05-10 ENCOUNTER — Other Ambulatory Visit: Payer: Self-pay

## 2012-05-10 LAB — CBC WITH DIFFERENTIAL/PLATELET
Eosinophils Absolute: 0.3 10*3/uL (ref 0.0–0.7)
Eosinophils Relative: 7 % — ABNORMAL HIGH (ref 0–5)
Hemoglobin: 12.2 g/dL — ABNORMAL LOW (ref 13.0–17.0)
Lymphs Abs: 0.7 10*3/uL (ref 0.7–4.0)
MCH: 31.6 pg (ref 26.0–34.0)
MCHC: 36 g/dL (ref 30.0–36.0)
MCV: 87.8 fL (ref 78.0–100.0)
Monocytes Relative: 8 % (ref 3–12)
Platelets: 374 10*3/uL (ref 150–400)
RBC: 3.86 MIL/uL — ABNORMAL LOW (ref 4.22–5.81)

## 2012-05-10 LAB — COMPLETE METABOLIC PANEL WITH GFR
ALT: 24 U/L (ref 0–53)
AST: 19 U/L (ref 0–37)
Calcium: 8.9 mg/dL (ref 8.4–10.5)
Chloride: 101 mEq/L (ref 96–112)
Creat: 0.58 mg/dL (ref 0.50–1.35)
Sodium: 134 mEq/L — ABNORMAL LOW (ref 135–145)
Total Protein: 7.1 g/dL (ref 6.0–8.3)

## 2012-05-13 ENCOUNTER — Other Ambulatory Visit: Payer: Self-pay

## 2012-05-13 LAB — AFB CULTURE, BLOOD

## 2012-05-13 LAB — HIV-1 RNA QUANT-NO REFLEX-BLD
HIV 1 RNA Quant: 352 copies/mL — ABNORMAL HIGH (ref <20)
HIV-1 RNA Quant, Log: 2.55 {Log} — ABNORMAL HIGH (ref <1.30)

## 2012-05-27 ENCOUNTER — Ambulatory Visit (INDEPENDENT_AMBULATORY_CARE_PROVIDER_SITE_OTHER): Payer: Medicaid Other | Admitting: Infectious Disease

## 2012-05-27 ENCOUNTER — Other Ambulatory Visit: Payer: Self-pay | Admitting: Licensed Clinical Social Worker

## 2012-05-27 ENCOUNTER — Encounter: Payer: Self-pay | Admitting: Infectious Disease

## 2012-05-27 VITALS — BP 130/84 | HR 114 | Temp 98.6°F | Wt 147.0 lb

## 2012-05-27 DIAGNOSIS — D893 Immune reconstitution syndrome: Secondary | ICD-10-CM

## 2012-05-27 DIAGNOSIS — B59 Pneumocystosis: Secondary | ICD-10-CM

## 2012-05-27 DIAGNOSIS — B259 Cytomegaloviral disease, unspecified: Secondary | ICD-10-CM

## 2012-05-27 DIAGNOSIS — B20 Human immunodeficiency virus [HIV] disease: Secondary | ICD-10-CM

## 2012-05-27 DIAGNOSIS — R6510 Systemic inflammatory response syndrome (SIRS) of non-infectious origin without acute organ dysfunction: Secondary | ICD-10-CM

## 2012-05-27 DIAGNOSIS — F411 Generalized anxiety disorder: Secondary | ICD-10-CM

## 2012-05-27 DIAGNOSIS — H3090 Unspecified chorioretinal inflammation, unspecified eye: Secondary | ICD-10-CM

## 2012-05-27 DIAGNOSIS — Z23 Encounter for immunization: Secondary | ICD-10-CM

## 2012-05-27 MED ORDER — CLONAZEPAM 0.5 MG PO TABS: 0.5000 mg | ORAL_TABLET | Freq: Every evening | ORAL | Status: DC | PRN

## 2012-05-27 NOTE — Progress Notes (Signed)
Subjective:    Patient ID: Curtis Burton, male    DOB: [birth year], 32 y.o.   MRN: [id number]  Fever  Pertinent negatives include no abdominal pain, chest pain, congestion, coughing, diarrhea, nausea, rash, sore throat, vomiting or wheezing.    32 year old Hispanic man with recently diagnosed HIV AIDS CMV retinitis and recent PCP pneumonia (latter dx made clinically) He finished his 21 day course of bactrim and prednisone. His most recent VL was not as suppressed as I would have expected in 300 range and Cd4 has dropped slightly to 80 but % is stable.  He is to see Dr. Allyne Gee in a few days to check up on eyes. VIsion is improving  His wife keeps after to take his ARV and he did apparently neglect to take Atripla on 2 nights 2 weeks ago.  EMphasized need for HIGH level of adherence and will recheck labs today.  He c/o having persistent problems with anxiety and problems sleeping and I proposed giving him clonazepam 0.5mg  qhs prn.    Review of Systems  Constitutional: Negative for fever, chills, diaphoresis, activity change, appetite change, fatigue and unexpected weight change.  HENT: Negative for congestion, sore throat, rhinorrhea, sneezing, trouble swallowing and sinus pressure.   Eyes: Negative for photophobia.  Respiratory: Negative for cough, chest tightness, shortness of breath, wheezing and stridor.   Cardiovascular: Negative for chest pain, palpitations and leg swelling.  Gastrointestinal: Negative for nausea, vomiting, abdominal pain, diarrhea, constipation, blood in stool, abdominal distention and anal bleeding.  Genitourinary: Negative for dysuria, hematuria, flank pain and difficulty urinating.  Musculoskeletal: Negative for myalgias, back pain, joint swelling, arthralgias and gait problem.  Skin: Negative for color change, pallor, rash and wound.  Neurological: Negative for dizziness, tremors, weakness and light-headedness.  Hematological: Negative for  adenopathy. Does not bruise/bleed easily.  Psychiatric/Behavioral: Negative for behavioral problems, confusion, sleep disturbance, dysphoric mood, decreased concentration and agitation. The patient is nervous/anxious.        Objective:   Physical Exam  Constitutional: He appears well-developed and well-nourished. No distress.  HENT:  Head: Normocephalic and atraumatic.  Mouth/Throat: No oropharyngeal exudate.  Thrush  Eyes: Conjunctivae and EOM are normal. No scleral icterus.  Neck: Normal range of motion. Neck supple. No JVD present.  Cardiovascular: Normal rate, regular rhythm and normal heart sounds.  Exam reveals no gallop and no friction rub.   No murmur heard. Pulmonary/Chest: Effort normal and breath sounds normal. No respiratory distress. He has no wheezes. He has no rales. He exhibits no tenderness.  Abdominal: He exhibits no distension and no mass. There is no tenderness. There is no rebound and no guarding.  Musculoskeletal: He exhibits no edema and no tenderness.  Lymphadenopathy:    He has no cervical adenopathy.  Neurological: He is alert. He exhibits normal muscle tone. Coordination normal.  Skin: Skin is warm and dry. He is not diaphoretic. No erythema. No pallor.  Psychiatric: He has a normal mood and affect. His behavior is normal. Judgment and thought content normal.          Assessment & Plan:  HIV: Recheck viral loadand CD4 count  And bring back in 2 months time  CMV retinitis: Continue valganciclovir, continued to be followed closely by Dr. Allyne Gee. He is also on topical steroids, vision improving  PCP: continue daily Bactrim for PCP prophylaxis.   M. avium prophylaxis: We'll continue azithromycin for now   Thrush: resolved  Fevers: intermittent ssx, suspect due to IRIS immune reconstitution given his viral  suppression. We will monitor.

## 2012-05-28 LAB — T-HELPER CELL (CD4) - (RCID CLINIC ONLY)
CD4 % Helper T Cell: 10 % — ABNORMAL LOW (ref 33–55)
CD4 T Cell Abs: 90 uL — ABNORMAL LOW (ref 400–2700)

## 2012-05-29 LAB — HIV-1 RNA ULTRAQUANT REFLEX TO GENTYP+
HIV 1 RNA Quant: 178 copies/mL — ABNORMAL HIGH (ref <20)
HIV-1 RNA Quant, Log: 2.25 {Log} — ABNORMAL HIGH (ref <1.30)

## 2012-07-15 ENCOUNTER — Other Ambulatory Visit (INDEPENDENT_AMBULATORY_CARE_PROVIDER_SITE_OTHER): Payer: Self-pay

## 2012-07-15 ENCOUNTER — Encounter: Payer: Self-pay | Admitting: *Deleted

## 2012-07-15 ENCOUNTER — Other Ambulatory Visit: Payer: Self-pay | Admitting: Infectious Disease

## 2012-07-15 DIAGNOSIS — B20 Human immunodeficiency virus [HIV] disease: Secondary | ICD-10-CM

## 2012-07-15 LAB — CBC WITH DIFFERENTIAL/PLATELET
Basophils Absolute: 0 10*3/uL (ref 0.0–0.1)
Basophils Relative: 1 % (ref 0–1)
Hemoglobin: 13 g/dL (ref 13.0–17.0)
MCHC: 34.9 g/dL (ref 30.0–36.0)
Monocytes Relative: 7 % (ref 3–12)
Neutro Abs: 4.5 10*3/uL (ref 1.7–7.7)
Neutrophils Relative %: 73 % (ref 43–77)
RDW: 13.7 % (ref 11.5–15.5)

## 2012-07-15 LAB — COMPLETE METABOLIC PANEL WITH GFR
AST: 19 U/L (ref 0–37)
Albumin: 3.9 g/dL (ref 3.5–5.2)
Alkaline Phosphatase: 129 U/L — ABNORMAL HIGH (ref 39–117)
Potassium: 4 mEq/L (ref 3.5–5.3)
Sodium: 136 mEq/L (ref 135–145)
Total Protein: 6.9 g/dL (ref 6.0–8.3)

## 2012-07-15 NOTE — Progress Notes (Signed)
Patient ID: Curtis Burton, male   DOB: 1980-06-03, 32 y.o.   MRN: [id number] Pt asked to talk with RN when he came in for Lab work.  Has had productive cough for over one month.  Sputum colorless.  Nasal drainage at night that makes him cough.  Is taking all of his HIV medications, is Septra DS, Azithromycin, Valcyte and Atripla.  First available appt, Wed., May 21 @ 10:30 w/ Dr. Drue Second.

## 2012-07-15 NOTE — Patient Instructions (Signed)
Pt to continue "robitussin" and drink more water until appt on Wednesday.

## 2012-07-16 LAB — HIV-1 RNA QUANT-NO REFLEX-BLD: HIV 1 RNA Quant: 38 copies/mL — ABNORMAL HIGH (ref <20)

## 2012-07-17 ENCOUNTER — Ambulatory Visit (INDEPENDENT_AMBULATORY_CARE_PROVIDER_SITE_OTHER): Payer: Self-pay | Admitting: Internal Medicine

## 2012-07-17 ENCOUNTER — Ambulatory Visit
Admission: RE | Admit: 2012-07-17 | Discharge: 2012-07-17 | Disposition: A | Discharge status: Home / Self Care | Payer: No Typology Code available for payment source | Source: Ambulatory Visit | Attending: Internal Medicine | Admitting: Internal Medicine

## 2012-07-17 ENCOUNTER — Encounter: Payer: Self-pay | Admitting: Internal Medicine

## 2012-07-17 VITALS — BP 123/75 | HR 102 | Temp 98.2°F | Wt 140.0 lb

## 2012-07-17 DIAGNOSIS — B20 Human immunodeficiency virus [HIV] disease: Secondary | ICD-10-CM

## 2012-07-17 DIAGNOSIS — R05 Cough: Secondary | ICD-10-CM

## 2012-07-17 DIAGNOSIS — R059 Cough, unspecified: Secondary | ICD-10-CM

## 2012-07-17 MED ORDER — HYDROCODONE-HOMATROPINE 5-1.5 MG/5ML PO SYRP: 5.0000 mL | ORAL_SOLUTION | Freq: Four times a day (QID) | ORAL | Status: DC | PRN

## 2012-07-17 MED ORDER — FLUCONAZOLE 200 MG PO TABS: 400.0000 mg | ORAL_TABLET | Freq: Every day | ORAL | Status: DC

## 2012-07-17 NOTE — Progress Notes (Signed)
[facility] CLINIC NOTE  RFV: routine visit Subjective:    Patient ID: Curtis Burton, male    DOB: 1981-01-13, 32 y.o.   MRN: [id number]  HPI Dysen is a 32yo Male with HIV c/b CMV retinitis, PCP +/- IRIS. CD 4 count of 70(9%) - down from 11%/VL 38 currently on atripla. Diagnosed by ophtho in the setting of cmv retinitis. He has been on atripla for 3 months now. Reports missing 2 doses in the last 30 days. He continues to be on OI proph for PCP and MAC. On Valgang for CMV retinitis  Last saw dr. Allyne Gee 2 months ago, next appt on June 6th. Left eye worse, not as clear. He sees floaters and black spots.? [place].   He reports 2 month history of dry cough and weight loss. Keeps him up at night. Not associated with post tussive emesis. He initially took robitussin but now no longer working for him.   Eating less, has lost 7 lb in 6-8 wk. Lack of appetite.  Works occasionally doing Holiday representative.   Originally born in [place] stayed there until 32 yo and then moved to [place] where he has been for the past 18 yrs.   He is here with wife (also HIV+) and translator Current Outpatient Prescriptions on File Prior to Visit  Medication Sig Dispense Refill  . azithromycin (ZITHROMAX) 600 MG tablet Take 2 tablets (1,200 mg total) by mouth once a week.  30 tablet  11  . clonazePAM (KLONOPIN) 0.5 MG tablet Take 1 tablet (0.5 mg total) by mouth at bedtime as needed for anxiety.  30 tablet  4  . efavirenz-emtricitabine-tenofovir (ATRIPLA) 600-200-300 MG per tablet Take 1 tablet by mouth at bedtime.  30 tablet  11  . sulfamethoxazole-trimethoprim (BACTRIM DS) 800-160 MG per tablet Take 2 tablets by mouth 3 (three) times daily.  102 tablet  0  . traZODone (DESYREL) 50 MG tablet Take 1 tablet (50 mg total) by mouth at bedtime.  30 tablet  11  . valGANciclovir (VALCYTE) 450 MG tablet Take 2 tablets (900 mg total) by mouth daily.  60 tablet  11   No current facility-administered medications on file prior to  visit.   Active Ambulatory Problems    Diagnosis Date Noted  . Focal retinitis and retinochoroiditis, peripheral 03/06/2012  . 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 Anemia 03/06/2012  . Insomnia 03/27/2012  . HCAP (healthcare-associated pneumonia) 03/30/2012  . PCP (pneumocystis carinii pneumonia) 03/31/2012  . CMV retinitis 04/02/2012  . Thrush 04/15/2012  . Generalized anxiety disorder 05/27/2012   Resolved Ambulatory Problems    Diagnosis Date Noted  . Fever 03/30/2012   Past Medical History  Diagnosis Date  . UTI (urinary tract infection)   . Retinitis    Social and family hx unchanged    Review of Systems  Constitutional: Negative for fever, chills, diaphoresis, activity change, appetite change, fatigue and unexpected weight change.  HENT: + dry cough. Negative for congestion, sore throat, rhinorrhea, sneezing, trouble swallowing and sinus pressure.  Eyes: blurred vision in left eye from cmv retinitis  Respiratory: Negative for cough, chest tightness, shortness of breath, wheezing and stridor.  Cardiovascular: Negative for chest pain, palpitations and leg swelling.  Gastrointestinal: Negative for nausea, vomiting, abdominal pain, diarrhea, constipation, blood in stool, abdominal distention and anal bleeding.  Genitourinary: Negative for dysuria, hematuria, flank pain and difficulty urinating.  Musculoskeletal: Negative for myalgias, back pain, joint swelling, arthralgias and gait problem.  Skin: Negative for color  change, pallor, rash and wound.  Neurological: Negative for dizziness, tremors, weakness and light-headedness.  Hematological: Negative for adenopathy. Does not bruise/bleed easily.  Psychiatric/Behavioral: Negative for behavioral problems, confusion, sleep disturbance, dysphoric mood, decreased concentration and agitation.       Objective:   Physical Exam BP 123/75  Pulse 102  Temp(Src) 98.2 F (36.8 C) (Oral)  Wt 140 lb  (63.504 kg)  BMI 24.81 kg/m2 Physical Exam  Constitutional: He is oriented to person, place, and time. He appears well-developed and well-nourished. No distress.  HENT:  Mouth/Throat: Oropharynx is clear and moist. No oropharyngeal exudate.  Cardiovascular: Normal rate, regular rhythm and normal heart sounds. Exam reveals no gallop and no friction rub.  No murmur heard.  Pulmonary/Chest: Effort normal and breath sounds normal. No respiratory distress. He has no wheezes.  Abdominal: Soft. Bowel sounds are normal. He exhibits no distension. There is no tenderness.  Lymphadenopathy:  He has no cervical adenopathy.  Neurological: He is alert and oriented to person, place, and time.  Skin: Skin is warm and dry. No rash noted. No erythema.  Psychiatric: He has a normal mood and affect. His behavior is normal.        Assessment & Plan:  hiv = continue with atripla. Having very little increase in cd 4 count, remains nearly undetectable after 3 months of therapy.  cmv retinitis = continue with valganciclovir. To see dr. Allyne Gee on June 6th.  Dry cough = will get cxr to see if any characteristic pattern for pcp. Will check pulse ox. Does not appear hypoxic. Will check cocci serology and quantiferon (likely had bcg). Will give cough medicine with codeine. Unable to get sputum since dry cough  Also may need to do proph for cocci = start fluconazole 400mg  daily in the meantime until we get lab results back, will check cocci serology

## 2012-07-19 LAB — QUANTIFERON TB GOLD ASSAY (BLOOD)
Interferon Gamma Release Assay: NEGATIVE
Mitogen value: 1.39 IU/mL
Quantiferon Tb Ag Minus Nil Value: 0 IU/mL
TB Ag value: 0.19 IU/mL

## 2012-07-29 ENCOUNTER — Other Ambulatory Visit: Payer: Self-pay | Admitting: Infectious Disease

## 2012-07-29 ENCOUNTER — Encounter: Payer: Self-pay | Admitting: Infectious Disease

## 2012-07-29 ENCOUNTER — Ambulatory Visit (INDEPENDENT_AMBULATORY_CARE_PROVIDER_SITE_OTHER): Payer: Self-pay | Admitting: Infectious Disease

## 2012-07-29 VITALS — BP 128/83 | HR 110 | Temp 98.4°F | Ht 64.0 in | Wt 133.0 lb

## 2012-07-29 DIAGNOSIS — R599 Enlarged lymph nodes, unspecified: Secondary | ICD-10-CM

## 2012-07-29 DIAGNOSIS — R590 Localized enlarged lymph nodes: Secondary | ICD-10-CM

## 2012-07-29 DIAGNOSIS — B259 Cytomegaloviral disease, unspecified: Secondary | ICD-10-CM

## 2012-07-29 DIAGNOSIS — H3090 Unspecified chorioretinal inflammation, unspecified eye: Secondary | ICD-10-CM

## 2012-07-29 DIAGNOSIS — R634 Abnormal weight loss: Secondary | ICD-10-CM

## 2012-07-29 DIAGNOSIS — R05 Cough: Secondary | ICD-10-CM | POA: Insufficient documentation

## 2012-07-29 DIAGNOSIS — R059 Cough, unspecified: Secondary | ICD-10-CM

## 2012-07-29 DIAGNOSIS — B20 Human immunodeficiency virus [HIV] disease: Secondary | ICD-10-CM

## 2012-07-29 MED ORDER — SULFAMETHOXAZOLE-TMP DS 800-160 MG PO TABS: 1.0000 | ORAL_TABLET | Freq: Once | ORAL | Status: DC

## 2012-07-29 NOTE — Progress Notes (Signed)
Subjective:    Patient ID: Curtis Burton, male    DOB: 01/07/1981, 32 y.o.   MRN: [id number]  HPI  32 year old Hispanic man with recently diagnosed HIV AIDS CMV retinitis and recent PCP pneumonia (latter dx made clinically) He finished his 21 day course of bactrim and prednisone. He has been following with  Dr. Allyne Gee  But vision in left eye not improving.  He saw my partner Dr. Drue Second on [phone] with complaints of dry hacking cough and CXR showed new hilar lymphadenopathy. She started him on fluconazole in case this was coccidiomycosis. 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  Patient actually feels his cough is improved since initiation of fluconazole. I'll plan on repeating some Coccidioides serologies and fungal antibody and antigen testing today.  He does however continue to lose weight. I will plan also getting a chest x-ray claims to not have missed 2 doses of his Atripla since last having been seen. R. load remains nearly undetectable CD4 count however has dropped from 200s to 70s. He is an only Bactrim once a day a multiple times a day he was on previously for PCP treatment.   Review of Systems  Constitutional: Negative for chills, diaphoresis, activity change, appetite change, fatigue and unexpected weight change.  HENT: Negative for rhinorrhea, sneezing, trouble swallowing and sinus pressure.   Eyes: Positive for visual disturbance. Negative for photophobia.  Respiratory: Positive for cough. Negative for chest tightness, shortness of breath and stridor.   Cardiovascular: Negative for palpitations and leg swelling.  Gastrointestinal: Negative for constipation, blood in stool, abdominal distention and anal bleeding.  Genitourinary: Negative for dysuria, hematuria, flank pain and difficulty urinating.  Musculoskeletal: Negative for myalgias, back pain, joint swelling, arthralgias and gait problem.  Skin: Negative for color change, pallor and  wound.  Neurological: Negative for dizziness, tremors, weakness and light-headedness.  Hematological: Negative for adenopathy. Does not bruise/bleed easily.  Psychiatric/Behavioral: Negative for behavioral problems, confusion, sleep disturbance, dysphoric mood, decreased concentration and agitation. The patient is nervous/anxious.        Objective:   Physical Exam  Constitutional: He appears well-developed and well-nourished. No distress.  HENT:  Head: Normocephalic and atraumatic.  Mouth/Throat: No oropharyngeal exudate.  Thrush  Eyes: Conjunctivae and EOM are normal. No scleral icterus.  Neck: Normal range of motion. Neck supple. No JVD present.  Cardiovascular: Normal rate, regular rhythm and normal heart sounds.  Exam reveals no gallop and no friction rub.   No murmur heard. Pulmonary/Chest: Effort normal and breath sounds normal. No respiratory distress. He has no wheezes. He has no rales. He exhibits no tenderness.  Abdominal: He exhibits no distension and no mass. There is no tenderness. There is no rebound and no guarding.  Musculoskeletal: He exhibits no edema and no tenderness.  Lymphadenopathy:    He has no cervical adenopathy.  Neurological: He is alert. He exhibits normal muscle tone. Coordination normal.  Skin: Skin is warm and dry. He is not diaphoretic. No erythema. No pallor.  Psychiatric: He has a normal mood and affect. His behavior is normal. Judgment and thought content normal.          Assessment & Plan:  HIV: Recheck viral loadand CD4 count  Bring back in one month  CMV retinitis: Continue valganciclovir, continued to be followed closely by Dr. Allyne Gee.   PCP: continue daily Bactrim for PCP prophylaxis.   M. avium prophylaxis: We'll continue azithromycin for now   Dry cough: For a possible endemic fungi including  Desert fungi. I will recheck her Coccidioides antibody test by, fixation Theora Gianotti testing as well as by immunodiffusion assays I will check the,  fixation and immunodiffusion testing by sending to reference lab at [facility]. Also check her Coccidioides antigen in serum and send this to [facility] along with Histo ag and blasto ag (latter both urine)  Lymphadenopathy: Truly could be due to immune reconstitution a repeat chest x-ray may consider CT scan.

## 2012-07-30 LAB — HIV-1 RNA QUANT-NO REFLEX-BLD
HIV 1 RNA Quant: 33 copies/mL — ABNORMAL HIGH (ref <20)
HIV-1 RNA Quant, Log: 1.52 {Log} — ABNORMAL HIGH (ref <1.30)

## 2012-08-05 LAB — OTHER SOLSTAS TEST

## 2012-08-07 ENCOUNTER — Telehealth: Payer: Self-pay | Admitting: Infectious Disease

## 2012-08-07 NOTE — Telephone Encounter (Signed)
Mr Curtis Burton repeat tests for cocci came back negative.   He can stop his fluconazole

## 2012-08-09 NOTE — Telephone Encounter (Signed)
Patient notified Curtis Burton  

## 2012-09-12 ENCOUNTER — Ambulatory Visit: Payer: Self-pay | Admitting: Infectious Disease

## 2012-09-26 ENCOUNTER — Ambulatory Visit: Payer: Self-pay | Admitting: Infectious Disease

## 2012-09-26 ENCOUNTER — Ambulatory Visit (INDEPENDENT_AMBULATORY_CARE_PROVIDER_SITE_OTHER): Payer: Self-pay | Admitting: Internal Medicine

## 2012-09-26 ENCOUNTER — Encounter: Payer: Self-pay | Admitting: Internal Medicine

## 2012-09-26 VITALS — BP 123/74 | HR 91 | Temp 97.8°F | Ht 63.0 in | Wt 141.0 lb

## 2012-09-26 DIAGNOSIS — B20 Human immunodeficiency virus [HIV] disease: Secondary | ICD-10-CM

## 2012-09-26 DIAGNOSIS — R05 Cough: Secondary | ICD-10-CM

## 2012-09-26 DIAGNOSIS — B259 Cytomegaloviral disease, unspecified: Secondary | ICD-10-CM

## 2012-09-26 DIAGNOSIS — H3090 Unspecified chorioretinal inflammation, unspecified eye: Secondary | ICD-10-CM

## 2012-09-26 LAB — CBC WITH DIFFERENTIAL/PLATELET
Basophils Relative: 1 % (ref 0–1)
Eosinophils Absolute: 0.3 10*3/uL (ref 0.0–0.7)
Hemoglobin: 13.6 g/dL (ref 13.0–17.0)
MCH: 32.3 pg (ref 26.0–34.0)
MCHC: 35.8 g/dL (ref 30.0–36.0)
Monocytes Relative: 6 % (ref 3–12)
Neutrophils Relative %: 63 % (ref 43–77)

## 2012-09-26 LAB — COMPLETE METABOLIC PANEL WITH GFR
ALT: 21 U/L (ref 0–53)
CO2: 25 mEq/L (ref 19–32)
Calcium: 8.9 mg/dL (ref 8.4–10.5)
Chloride: 104 mEq/L (ref 96–112)
GFR, Est African American: >89 mL/min
Potassium: 4.4 mEq/L (ref 3.5–5.3)
Sodium: 139 mEq/L (ref 135–145)
Total Bilirubin: 0.2 mg/dL — ABNORMAL LOW (ref 0.3–1.2)
Total Protein: 7.1 g/dL (ref 6.0–8.3)

## 2012-09-26 NOTE — Assessment & Plan Note (Signed)
He seems to be doing well with his eyes and does have followup with the ophthalmologist.

## 2012-09-26 NOTE — Assessment & Plan Note (Signed)
She is doing better and I will check his labs today. If everything is reassuring, he will return in 2 months.

## 2012-09-26 NOTE — Progress Notes (Signed)
  Subjective:    Patient ID: Curtis Burton, male    DOB: [birth year], 32 y.o.   MRN: [id number]  HPI Comes in for followup. He has recently started on Atripla and his course has been complicated by PCP pneumonia, hilar lymphadenopathy of unknown etiology, and CMV retinitis. He tells me though today that everything seems to be doing well. He has gained 8 pounds, his vision is much better and does see to ophthalmologist, his cough has resolved and he overall feels much better. He takes his Atripla with no missed doses. He is here with his girlfriend who also has been tested. He has no concerns or questions.   Review of Systems  Constitutional: Negative for fever, activity change, appetite change, fatigue and unexpected weight change.  HENT: Negative for sore throat and trouble swallowing.   Eyes: Negative for photophobia, discharge, redness and visual disturbance.  Respiratory: Negative for cough and shortness of breath.   Cardiovascular: Negative for chest pain and leg swelling.  Gastrointestinal: Negative for nausea, abdominal pain and diarrhea.  Musculoskeletal: Negative for myalgias and arthralgias.  Skin: Negative for rash.  Neurological: Negative for dizziness, light-headedness and headaches.  Hematological: Negative for adenopathy.  Psychiatric/Behavioral: Negative for dysphoric mood.       Objective:   Physical Exam  Constitutional: He is oriented to person, place, and time. He appears well-developed and well-nourished. No distress.  HENT:  Mouth/Throat: Oropharynx is clear and moist. No oropharyngeal exudate.  Eyes: Right eye exhibits no discharge. Left eye exhibits no discharge. No scleral icterus.  Cardiovascular: Normal rate, regular rhythm and normal heart sounds.   No murmur heard. Pulmonary/Chest: Effort normal and breath sounds normal. No respiratory distress. He has no wheezes. He has no rales.  Lymphadenopathy:    He has no cervical adenopathy.  Neurological:  He is alert and oriented to person, place, and time.  Skin: Skin is warm and dry. No rash noted.  Psychiatric: He has a normal mood and affect. His behavior is normal.          Assessment & Plan:

## 2012-09-26 NOTE — Assessment & Plan Note (Signed)
This has resolved. I will recheck his chest x-ray once he gets the [facility] card to be sure that his hilar lymphadenopathy is not significantly worse. I do suspect it will get better as his immune system improves

## 2012-09-27 LAB — T-HELPER CELL (CD4) - (RCID CLINIC ONLY): CD4 T Cell Abs: 90 uL — ABNORMAL LOW (ref 400–2700)

## 2012-09-30 LAB — HIV-1 RNA QUANT-NO REFLEX-BLD: HIV-1 RNA Quant, Log: <1.3 {Log} (ref <1.30)

## 2012-10-02 ENCOUNTER — Encounter: Payer: Self-pay | Admitting: Infectious Disease

## 2012-11-26 ENCOUNTER — Ambulatory Visit (INDEPENDENT_AMBULATORY_CARE_PROVIDER_SITE_OTHER): Payer: Self-pay | Admitting: Infectious Disease

## 2012-11-26 ENCOUNTER — Encounter: Payer: Self-pay | Admitting: Infectious Disease

## 2012-11-26 VITALS — BP 120/75 | HR 79 | Temp 97.6°F | Ht 64.0 in | Wt 154.0 lb

## 2012-11-26 DIAGNOSIS — B59 Pneumocystosis: Secondary | ICD-10-CM

## 2012-11-26 DIAGNOSIS — H3090 Unspecified chorioretinal inflammation, unspecified eye: Secondary | ICD-10-CM

## 2012-11-26 DIAGNOSIS — B20 Human immunodeficiency virus [HIV] disease: Secondary | ICD-10-CM

## 2012-11-26 DIAGNOSIS — B259 Cytomegaloviral disease, unspecified: Secondary | ICD-10-CM

## 2012-11-26 DIAGNOSIS — Z23 Encounter for immunization: Secondary | ICD-10-CM

## 2012-11-26 LAB — COMPLETE METABOLIC PANEL WITH GFR
ALT: 24 U/L (ref 0–53)
AST: 26 U/L (ref 0–37)
Albumin: 4.1 g/dL (ref 3.5–5.2)
Alkaline Phosphatase: 114 U/L (ref 39–117)
Calcium: 9.1 mg/dL (ref 8.4–10.5)
Chloride: 104 mEq/L (ref 96–112)
Potassium: 3.9 mEq/L (ref 3.5–5.3)
Sodium: 138 mEq/L (ref 135–145)

## 2012-11-26 LAB — CBC WITH DIFFERENTIAL/PLATELET
Eosinophils Absolute: 0.2 10*3/uL (ref 0.0–0.7)
Eosinophils Relative: 5 % (ref 0–5)
HCT: 38.5 % — ABNORMAL LOW (ref 39.0–52.0)
Lymphocytes Relative: 29 % (ref 12–46)
Lymphs Abs: 1.3 10*3/uL (ref 0.7–4.0)
MCH: 32.6 pg (ref 26.0–34.0)
MCV: 91.7 fL (ref 78.0–100.0)
Monocytes Absolute: 0.3 10*3/uL (ref 0.1–1.0)
Platelets: 300 10*3/uL (ref 150–400)
RBC: 4.2 MIL/uL — ABNORMAL LOW (ref 4.22–5.81)
RDW: 13.8 % (ref 11.5–15.5)

## 2012-11-26 NOTE — Progress Notes (Signed)
  Subjective:    Patient ID: Curtis Burton, male    DOB: 1981-01-30, 32 y.o.   MRN: [id number]  HPI   32 year old Hispanic man with recently diagnosed HIV AIDS CMV retinitis and  PCP pneumonia (latter dx made clinically).   He has been following with  Dr. Allyne Gee for CMV. His HIV has been perfectly suppressed on Atripla but CD4 was still at 90 when checked in July.  He NEEDS TO ENROLL IN FALL ADAP and I had asked that he meet with Britta Mccreedy today but he ended up not meeting with her before leaving the clinic--likely due to miscommunication absent a Engineer, structural.   I explained need to remain on anti-CMV therapy and PCP and M avium prophylaxis until he had adequete T cell reconstitution. We discussed possible change to INI regimen such as STRIBILD or TRIUMEQ.   Review of Systems  Constitutional: Negative for chills, diaphoresis, activity change, appetite change, fatigue and unexpected weight change.  HENT: Negative for rhinorrhea, sneezing, trouble swallowing and sinus pressure.   Eyes: Positive for visual disturbance. Negative for photophobia.  Respiratory: Negative for cough, chest tightness, shortness of breath and stridor.   Cardiovascular: Negative for palpitations and leg swelling.  Gastrointestinal: Negative for constipation, blood in stool, abdominal distention and anal bleeding.  Genitourinary: Negative for dysuria, hematuria, flank pain and difficulty urinating.  Musculoskeletal: Negative for myalgias, back pain, joint swelling, arthralgias and gait problem.  Skin: Negative for color change, pallor and wound.  Neurological: Negative for dizziness, tremors, weakness and light-headedness.  Hematological: Negative for adenopathy. Does not bruise/bleed easily.  Psychiatric/Behavioral: Negative for behavioral problems, confusion, sleep disturbance, dysphoric mood, decreased concentration and agitation. The patient is not nervous/anxious.        Objective:   Physical  Exam  Constitutional: He appears well-developed and well-nourished. No distress.  HENT:  Head: Normocephalic and atraumatic.  Mouth/Throat: No oropharyngeal exudate.  Thrush  Eyes: Conjunctivae and EOM are normal. No scleral icterus.  Neck: Normal range of motion. Neck supple. No JVD present.  Cardiovascular: Normal rate, regular rhythm and normal heart sounds.  Exam reveals no gallop and no friction rub.   No murmur heard. Pulmonary/Chest: Effort normal and breath sounds normal. No respiratory distress. He has no wheezes. He has no rales. He exhibits no tenderness.  Abdominal: He exhibits no distension and no mass. There is no tenderness. There is no rebound and no guarding.  Musculoskeletal: He exhibits no edema and no tenderness.  Lymphadenopathy:    He has no cervical adenopathy.  Neurological: He is alert. He exhibits normal muscle tone. Coordination normal.  Skin: Skin is warm and dry. He is not diaphoretic. No erythema. No pallor.  Psychiatric: He has a normal mood and affect. His behavior is normal. Judgment and thought content normal.          Assessment & Plan:  HIV: Recheck viral loadand CD4 count  Bring back in 2 months.   NEEDS ADAP enrollment.   CMV retinitis: Continue valganciclovir, continued to be followed closely by Dr. Allyne Gee.   PCP: continue daily Bactrim for PCP prophylaxis.   M. avium prophylaxis: We'll continue azithromycin for now

## 2012-11-27 LAB — T-HELPER CELL (CD4) - (RCID CLINIC ONLY): CD4 % Helper T Cell: 9 % — ABNORMAL LOW (ref 33–55)

## 2012-11-27 LAB — HIV-1 RNA QUANT-NO REFLEX-BLD
HIV 1 RNA Quant: <20 copies/mL (ref <20)
HIV-1 RNA Quant, Log: <1.3 {Log} (ref <1.30)

## 2012-11-28 ENCOUNTER — Ambulatory Visit: Payer: Self-pay

## 2012-12-19 ENCOUNTER — Encounter: Payer: Self-pay | Admitting: *Deleted

## 2012-12-23 ENCOUNTER — Other Ambulatory Visit: Payer: Self-pay | Admitting: *Deleted

## 2012-12-23 DIAGNOSIS — B20 Human immunodeficiency virus [HIV] disease: Secondary | ICD-10-CM

## 2012-12-23 MED ORDER — EFAVIRENZ-EMTRICITAB-TENOFOVIR 600-200-300 MG PO TABS: 1.0000 | ORAL_TABLET | Freq: Every day | ORAL | Status: DC

## 2012-12-23 MED ORDER — AZITHROMYCIN 600 MG PO TABS: 1200.0000 mg | ORAL_TABLET | ORAL | Status: DC

## 2012-12-23 MED ORDER — SULFAMETHOXAZOLE-TMP DS 800-160 MG PO TABS: 1.0000 | ORAL_TABLET | Freq: Once | ORAL | Status: DC

## 2013-01-15 ENCOUNTER — Other Ambulatory Visit: Payer: Self-pay

## 2013-01-29 ENCOUNTER — Encounter: Payer: Self-pay | Admitting: Infectious Disease

## 2013-01-29 ENCOUNTER — Ambulatory Visit (INDEPENDENT_AMBULATORY_CARE_PROVIDER_SITE_OTHER): Payer: Self-pay | Admitting: Infectious Disease

## 2013-01-29 VITALS — BP 127/77 | HR 96 | Temp 98.0°F | Wt 159.0 lb

## 2013-01-29 DIAGNOSIS — Z23 Encounter for immunization: Secondary | ICD-10-CM

## 2013-01-29 DIAGNOSIS — H3090 Unspecified chorioretinal inflammation, unspecified eye: Secondary | ICD-10-CM

## 2013-01-29 DIAGNOSIS — B259 Cytomegaloviral disease, unspecified: Secondary | ICD-10-CM

## 2013-01-29 DIAGNOSIS — B20 Human immunodeficiency virus [HIV] disease: Secondary | ICD-10-CM

## 2013-01-29 NOTE — Progress Notes (Signed)
  Subjective:    Patient ID: Curtis Burton, male    DOB: 05/27/1980, 32 y.o.   MRN: [id number]  HPI   32 year old Hispanic man with recently diagnosed HIV AIDS CMV retinitis and  PCP pneumonia (latter dx made clinically).   He has been following with  Dr. Allyne Gee for CMV. His HIV has been perfectly suppressed on Atripla but CD4 was  Been slow to climb. He has seen Dr. Allyne Gee and is off eye drops. He is still on valcyte and vision is stable.     Review of Systems  Constitutional: Negative for chills, diaphoresis, activity change, appetite change, fatigue and unexpected weight change.  HENT: Negative for rhinorrhea, sinus pressure, sneezing and trouble swallowing.   Eyes: Positive for visual disturbance. Negative for photophobia.  Respiratory: Negative for cough, chest tightness, shortness of breath and stridor.   Cardiovascular: Negative for palpitations and leg swelling.  Gastrointestinal: Negative for constipation, blood in stool, abdominal distention and anal bleeding.  Genitourinary: Negative for dysuria, hematuria, flank pain and difficulty urinating.  Musculoskeletal: Negative for arthralgias, back pain, gait problem, joint swelling and myalgias.  Skin: Negative for color change, pallor and wound.  Neurological: Negative for dizziness, tremors, weakness and light-headedness.  Hematological: Negative for adenopathy. Does not bruise/bleed easily.  Psychiatric/Behavioral: Negative for behavioral problems, confusion, sleep disturbance, dysphoric mood, decreased concentration and agitation. The patient is not nervous/anxious.        Objective:   Physical Exam  Constitutional: He appears well-developed and well-nourished. No distress.  HENT:  Head: Normocephalic and atraumatic.  Mouth/Throat: No oropharyngeal exudate.  Thrush  Eyes: Conjunctivae and EOM are normal. No scleral icterus.  Neck: Normal range of motion. Neck supple. No JVD present.  Cardiovascular: Normal  rate, regular rhythm and normal heart sounds.  Exam reveals no gallop and no friction rub.   No murmur heard. Pulmonary/Chest: Effort normal and breath sounds normal. No respiratory distress. He has no wheezes. He has no rales. He exhibits no tenderness.  Abdominal: He exhibits no distension and no mass. There is no tenderness. There is no rebound and no guarding.  Musculoskeletal: He exhibits no edema and no tenderness.  Lymphadenopathy:    He has no cervical adenopathy.  Neurological: He is alert. He exhibits normal muscle tone. Coordination normal.  Skin: Skin is warm and dry. He is not diaphoretic. No erythema. No pallor.  Psychiatric: He has a normal mood and affect. His behavior is normal. Judgment and thought content normal.          Assessment & Plan:  HIV: Recheck viral loadand CD4 count  Bring back in 2 months in February to eNSURE  ADAPre- enrollment.   CMV retinitis: Continue valganciclovir, continued to be followed closely by Dr. Allyne Gee.   PCP: continue daily Bactrim for PCP prophylaxis.   M. avium prophylaxis:  Dc now with CD4 adequetely up  Flu vacccine given

## 2013-01-30 LAB — COMPLETE METABOLIC PANEL WITH GFR
AST: 55 U/L — ABNORMAL HIGH (ref 0–37)
Albumin: 4.1 g/dL (ref 3.5–5.2)
BUN: 15 mg/dL (ref 6–23)
CO2: 23 mEq/L (ref 19–32)
Calcium: 9 mg/dL (ref 8.4–10.5)
Chloride: 103 mEq/L (ref 96–112)
Creat: 0.81 mg/dL (ref 0.50–1.35)
GFR, Est African American: >89 mL/min
GFR, Est Non African American: >89 mL/min
Glucose, Bld: 93 mg/dL (ref 70–99)
Potassium: 4.1 mEq/L (ref 3.5–5.3)

## 2013-01-30 LAB — CBC WITH DIFFERENTIAL/PLATELET
Basophils Absolute: 0.1 10*3/uL (ref 0.0–0.1)
Basophils Relative: 1 % (ref 0–1)
Eosinophils Relative: 6 % — ABNORMAL HIGH (ref 0–5)
HCT: 37 % — ABNORMAL LOW (ref 39.0–52.0)
Lymphocytes Relative: 30 % (ref 12–46)
MCV: 91.1 fL (ref 78.0–100.0)
Monocytes Absolute: 0.5 10*3/uL (ref 0.1–1.0)
Neutrophils Relative %: 54 % (ref 43–77)
Platelets: 305 10*3/uL (ref 150–400)
RBC: 4.06 MIL/uL — ABNORMAL LOW (ref 4.22–5.81)
RDW: 13.2 % (ref 11.5–15.5)
WBC: 5.2 10*3/uL (ref 4.0–10.5)

## 2013-01-31 LAB — T-HELPER CELL (CD4) - (RCID CLINIC ONLY)
CD4 % Helper T Cell: 11 % — ABNORMAL LOW (ref 33–55)
CD4 T Cell Abs: 160 /uL — ABNORMAL LOW (ref 400–2700)

## 2013-03-27 ENCOUNTER — Other Ambulatory Visit: Payer: Self-pay | Admitting: Licensed Clinical Social Worker

## 2013-03-27 DIAGNOSIS — B20 Human immunodeficiency virus [HIV] disease: Secondary | ICD-10-CM

## 2013-03-27 MED ORDER — EFAVIRENZ-EMTRICITAB-TENOFOVIR 600-200-300 MG PO TABS: 1.0000 | ORAL_TABLET | Freq: Every day | ORAL | Status: DC

## 2013-03-27 MED ORDER — VALGANCICLOVIR HCL 450 MG PO TABS: 900.0000 mg | ORAL_TABLET | Freq: Every day | ORAL | Status: DC

## 2013-03-27 MED ORDER — SULFAMETHOXAZOLE-TMP DS 800-160 MG PO TABS: 1.0000 | ORAL_TABLET | Freq: Once | ORAL | Status: DC

## 2013-03-28 ENCOUNTER — Other Ambulatory Visit: Payer: Self-pay | Admitting: Licensed Clinical Social Worker

## 2013-04-28 ENCOUNTER — Other Ambulatory Visit: Payer: Self-pay | Admitting: *Deleted

## 2013-04-29 ENCOUNTER — Other Ambulatory Visit: Payer: Self-pay

## 2013-04-29 MED ORDER — TRAZODONE HCL 50 MG PO TABS: 50.0000 mg | ORAL_TABLET | Freq: Every day | ORAL | Status: DC

## 2013-04-30 ENCOUNTER — Other Ambulatory Visit (INDEPENDENT_AMBULATORY_CARE_PROVIDER_SITE_OTHER): Payer: Self-pay

## 2013-04-30 ENCOUNTER — Encounter: Payer: Self-pay | Admitting: *Deleted

## 2013-04-30 DIAGNOSIS — B20 Human immunodeficiency virus [HIV] disease: Secondary | ICD-10-CM

## 2013-04-30 DIAGNOSIS — Z79899 Other long term (current) drug therapy: Secondary | ICD-10-CM

## 2013-04-30 DIAGNOSIS — Z113 Encounter for screening for infections with a predominantly sexual mode of transmission: Secondary | ICD-10-CM

## 2013-04-30 LAB — COMPLETE METABOLIC PANEL WITH GFR
ALK PHOS: 93 U/L (ref 39–117)
ALT: 24 U/L (ref 0–53)
AST: 22 U/L (ref 0–37)
Albumin: 4.1 g/dL (ref 3.5–5.2)
BUN: 11 mg/dL (ref 6–23)
CHLORIDE: 106 meq/L (ref 96–112)
CO2: 23 mEq/L (ref 19–32)
CREATININE: 0.56 mg/dL (ref 0.50–1.35)
Calcium: 8.8 mg/dL (ref 8.4–10.5)
GFR, Est African American: >89 mL/min
GFR, Est Non African American: >89 mL/min
Glucose, Bld: 98 mg/dL (ref 70–99)
Potassium: 4.3 mEq/L (ref 3.5–5.3)
Sodium: 138 mEq/L (ref 135–145)
Total Bilirubin: 0.4 mg/dL (ref 0.2–1.2)
Total Protein: 6.4 g/dL (ref 6.0–8.3)

## 2013-04-30 LAB — CBC WITH DIFFERENTIAL/PLATELET
Basophils Absolute: 0 10*3/uL (ref 0.0–0.1)
Basophils Relative: 1 % (ref 0–1)
EOS PCT: 6 % — AB (ref 0–5)
Eosinophils Absolute: 0.3 10*3/uL (ref 0.0–0.7)
HCT: 39.6 % (ref 39.0–52.0)
Hemoglobin: 14.2 g/dL (ref 13.0–17.0)
LYMPHS PCT: 24 % (ref 12–46)
Lymphs Abs: 1.1 10*3/uL (ref 0.7–4.0)
MCH: 32.7 pg (ref 26.0–34.0)
MCHC: 35.9 g/dL (ref 30.0–36.0)
MCV: 91.2 fL (ref 78.0–100.0)
MONO ABS: 0.4 10*3/uL (ref 0.1–1.0)
MONOS PCT: 10 % (ref 3–12)
Neutro Abs: 2.6 10*3/uL (ref 1.7–7.7)
Neutrophils Relative %: 59 % (ref 43–77)
PLATELETS: 297 10*3/uL (ref 150–400)
RBC: 4.34 MIL/uL (ref 4.22–5.81)
RDW: 12.7 % (ref 11.5–15.5)
WBC: 4.4 10*3/uL (ref 4.0–10.5)

## 2013-04-30 LAB — LIPID PANEL
Cholesterol: 155 mg/dL (ref 0–200)
HDL: 43 mg/dL (ref >39)
LDL Cholesterol: 97 mg/dL (ref 0–99)
TRIGLYCERIDES: 76 mg/dL (ref <150)
Total CHOL/HDL Ratio: 3.6 Ratio
VLDL: 15 mg/dL (ref 0–40)

## 2013-04-30 LAB — RPR

## 2013-05-01 LAB — T-HELPER CELL (CD4) - (RCID CLINIC ONLY)
CD4 % Helper T Cell: 13 % — ABNORMAL LOW (ref 33–55)
CD4 T Cell Abs: 160 /uL — ABNORMAL LOW (ref 400–2700)

## 2013-05-01 LAB — HIV-1 RNA QUANT-NO REFLEX-BLD
HIV 1 RNA QUANT: 21 {copies}/mL (ref <20)
HIV-1 RNA QUANT, LOG: 1.32 {Log} — AB (ref <1.30)

## 2013-05-02 LAB — URINE CYTOLOGY ANCILLARY ONLY
Chlamydia: NEGATIVE
NEISSERIA GONORRHEA: NEGATIVE

## 2013-05-05 ENCOUNTER — Other Ambulatory Visit: Payer: Self-pay | Admitting: *Deleted

## 2013-05-05 DIAGNOSIS — B20 Human immunodeficiency virus [HIV] disease: Secondary | ICD-10-CM

## 2013-05-05 MED ORDER — EFAVIRENZ-EMTRICITAB-TENOFOVIR 600-200-300 MG PO TABS: 1.0000 | ORAL_TABLET | Freq: Every day | ORAL | Status: DC

## 2013-05-05 NOTE — Telephone Encounter (Signed)
ADAP application 

## 2013-05-13 ENCOUNTER — Ambulatory Visit: Payer: Self-pay | Admitting: Infectious Disease

## 2013-08-28 ENCOUNTER — Ambulatory Visit: Payer: Self-pay | Admitting: Infectious Disease

## 2013-09-24 ENCOUNTER — Ambulatory Visit (INDEPENDENT_AMBULATORY_CARE_PROVIDER_SITE_OTHER): Payer: Self-pay | Admitting: Infectious Disease

## 2013-09-24 ENCOUNTER — Encounter: Payer: Self-pay | Admitting: Infectious Disease

## 2013-09-24 VITALS — BP 127/81 | HR 75 | Temp 97.9°F | Wt 152.0 lb

## 2013-09-24 DIAGNOSIS — B259 Cytomegaloviral disease, unspecified: Secondary | ICD-10-CM

## 2013-09-24 DIAGNOSIS — H3090 Unspecified chorioretinal inflammation, unspecified eye: Secondary | ICD-10-CM

## 2013-09-24 DIAGNOSIS — B20 Human immunodeficiency virus [HIV] disease: Secondary | ICD-10-CM

## 2013-09-24 DIAGNOSIS — Z113 Encounter for screening for infections with a predominantly sexual mode of transmission: Secondary | ICD-10-CM

## 2013-09-24 DIAGNOSIS — B59 Pneumocystosis: Secondary | ICD-10-CM

## 2013-09-24 LAB — CBC WITH DIFFERENTIAL/PLATELET
Basophils Absolute: 0.1 10*3/uL (ref 0.0–0.1)
Basophils Relative: 1 % (ref 0–1)
Eosinophils Absolute: 0.2 10*3/uL (ref 0.0–0.7)
Eosinophils Relative: 3 % (ref 0–5)
HCT: 40.2 % (ref 39.0–52.0)
Hemoglobin: 14.2 g/dL (ref 13.0–17.0)
LYMPHS PCT: 31 % (ref 12–46)
Lymphs Abs: 1.8 10*3/uL (ref 0.7–4.0)
MCH: 32.6 pg (ref 26.0–34.0)
MCHC: 35.3 g/dL (ref 30.0–36.0)
MCV: 92.4 fL (ref 78.0–100.0)
Monocytes Absolute: 0.3 10*3/uL (ref 0.1–1.0)
Monocytes Relative: 5 % (ref 3–12)
Neutro Abs: 3.4 10*3/uL (ref 1.7–7.7)
Neutrophils Relative %: 60 % (ref 43–77)
PLATELETS: 276 10*3/uL (ref 150–400)
RBC: 4.35 MIL/uL (ref 4.22–5.81)
RDW: 13.5 % (ref 11.5–15.5)
WBC: 5.7 10*3/uL (ref 4.0–10.5)

## 2013-09-24 LAB — COMPLETE METABOLIC PANEL WITH GFR
ALBUMIN: 4.5 g/dL (ref 3.5–5.2)
ALT: 19 U/L (ref 0–53)
AST: 21 U/L (ref 0–37)
Alkaline Phosphatase: 88 U/L (ref 39–117)
BUN: 13 mg/dL (ref 6–23)
CALCIUM: 9.1 mg/dL (ref 8.4–10.5)
CO2: 27 mEq/L (ref 19–32)
Chloride: 105 mEq/L (ref 96–112)
Creat: 0.72 mg/dL (ref 0.50–1.35)
GFR, Est African American: >89 mL/min
GFR, Est Non African American: >89 mL/min
Glucose, Bld: 66 mg/dL — ABNORMAL LOW (ref 70–99)
POTASSIUM: 3.8 meq/L (ref 3.5–5.3)
SODIUM: 140 meq/L (ref 135–145)
TOTAL PROTEIN: 7.2 g/dL (ref 6.0–8.3)
Total Bilirubin: 0.4 mg/dL (ref 0.2–1.2)

## 2013-09-24 MED ORDER — ELVITEG-COBIC-EMTRICIT-TENOFDF 150-150-200-300 MG PO TABS: 1.0000 | ORAL_TABLET | Freq: Every day | ORAL | Status: DC

## 2013-09-24 NOTE — Progress Notes (Signed)
  Subjective:    Patient ID: Curtis Burton, male    DOB: [birth year], 33 y.o.   Curtis KlinefelterMRN: [id number]  HPI   33 year old Hispanic man with recently diagnosed HIV AIDS CMV retinitis and  PCP pneumonia (latter dx made clinically).   He has been following with  Dr. Allyne Burton for CMV. His HIV has been perfectly suppressed on Atripla but CD4 was  Been slow to climb. He has seen Dr. Allyne Burton but not recently He is still on valcyte and vision is stable  Lab Results  Component Value Date   HIV1RNAQUANT 21 04/30/2013   Lab Results  Component Value Date   CD4TABS 160* 04/30/2013   CD4TABS 160* 01/29/2013   CD4TABS 130* 11/26/2012        Review of Systems  Constitutional: Negative for chills, diaphoresis, activity change, appetite change, fatigue and unexpected weight change.  HENT: Negative for rhinorrhea, sinus pressure, sneezing and trouble swallowing.   Eyes: Negative for photophobia.  Respiratory: Negative for cough, chest tightness, shortness of breath and stridor.   Cardiovascular: Negative for palpitations and leg swelling.  Gastrointestinal: Negative for constipation, blood in stool, abdominal distention and anal bleeding.  Genitourinary: Negative for dysuria, hematuria, flank pain and difficulty urinating.  Musculoskeletal: Negative for arthralgias, back pain, gait problem, joint swelling and myalgias.  Skin: Negative for color change, pallor and wound.  Neurological: Negative for dizziness, tremors, weakness and light-headedness.  Hematological: Negative for adenopathy. Does not bruise/bleed easily.  Psychiatric/Behavioral: Negative for behavioral problems, confusion, sleep disturbance, dysphoric mood, decreased concentration and agitation. The patient is not nervous/anxious.        Objective:   Physical Exam  Constitutional: He appears well-developed and well-nourished. No distress.  HENT:  Head: Normocephalic and atraumatic.  Mouth/Throat: Oropharynx is clear and moist. No  oropharyngeal exudate.  Thrush  Eyes: Conjunctivae and EOM are normal. No scleral icterus.  Neck: Normal range of motion. Neck supple. No JVD present.  Cardiovascular: Normal rate, regular rhythm and normal heart sounds.  Exam reveals no gallop and no friction rub.   No murmur heard. Pulmonary/Chest: Effort normal and breath sounds normal. No respiratory distress. He has no wheezes. He has no rales. He exhibits no tenderness.  Abdominal: He exhibits no distension and no mass. There is no tenderness. There is no rebound and no guarding.  Musculoskeletal: He exhibits no edema and no tenderness.  Lymphadenopathy:    He has no cervical adenopathy.  Neurological: He is alert. He exhibits normal muscle tone. Coordination normal.  Skin: Skin is warm and dry. He is not diaphoretic. No erythema. No pallor.  Psychiatric: He has a normal mood and affect. His behavior is normal. Judgment and thought content normal.          Assessment & Plan:  HIV: Recheck viral loadand CD4 count . Change to treadmill to see if this can ensure better immune reconstitution of a CD4 count .Bring back in 1  eNSURE  ADAPre- enrollment--> thru March  I spent greater than 25 minutes with the patient including greater than 50% of time in face to face counsel of the patient and in coordination of their care with Engineer, structuralpanish translator.   CMV retinitis: If CD4 count is still above 100 I will discontinue his valganciclovir Dr. Allyne Burton.   PCP: continue daily Bactrim for PCP prophylaxis.

## 2013-09-25 LAB — RPR

## 2013-09-26 LAB — HIV-1 RNA QUANT-NO REFLEX-BLD: HIV 1 RNA Quant: <20 copies/mL (ref <20)

## 2013-09-26 LAB — T-HELPER CELL (CD4) - (RCID CLINIC ONLY)
CD4 T CELL ABS: 250 /uL — AB (ref 400–2700)
CD4 T CELL HELPER: 14 % — AB (ref 33–55)

## 2013-09-29 LAB — HLA B*5701: HLA-B*5701 w/rflx HLA-B High: NEGATIVE

## 2013-10-08 ENCOUNTER — Ambulatory Visit: Payer: Self-pay

## 2013-10-08 ENCOUNTER — Other Ambulatory Visit: Payer: Self-pay | Admitting: Licensed Clinical Social Worker

## 2013-10-08 MED ORDER — ELVITEG-COBIC-EMTRICIT-TENOFDF 150-150-200-300 MG PO TABS: 1.0000 | ORAL_TABLET | Freq: Every day | ORAL | Status: DC

## 2013-10-27 ENCOUNTER — Ambulatory Visit: Payer: Self-pay | Admitting: Infectious Disease

## 2014-01-26 ENCOUNTER — Ambulatory Visit (INDEPENDENT_AMBULATORY_CARE_PROVIDER_SITE_OTHER): Payer: Self-pay | Admitting: Licensed Clinical Social Worker

## 2014-01-26 ENCOUNTER — Encounter: Payer: Self-pay | Admitting: Infectious Disease

## 2014-01-26 ENCOUNTER — Ambulatory Visit (INDEPENDENT_AMBULATORY_CARE_PROVIDER_SITE_OTHER): Payer: Self-pay | Admitting: Infectious Disease

## 2014-01-26 ENCOUNTER — Other Ambulatory Visit: Payer: Self-pay | Admitting: Infectious Disease

## 2014-01-26 VITALS — BP 126/86 | HR 80 | Temp 98.1°F | Ht 64.0 in | Wt 158.5 lb

## 2014-01-26 DIAGNOSIS — B20 Human immunodeficiency virus [HIV] disease: Secondary | ICD-10-CM

## 2014-01-26 DIAGNOSIS — B259 Cytomegaloviral disease, unspecified: Secondary | ICD-10-CM

## 2014-01-26 DIAGNOSIS — Z113 Encounter for screening for infections with a predominantly sexual mode of transmission: Secondary | ICD-10-CM

## 2014-01-26 DIAGNOSIS — Z23 Encounter for immunization: Secondary | ICD-10-CM

## 2014-01-26 DIAGNOSIS — H3090 Unspecified chorioretinal inflammation, unspecified eye: Secondary | ICD-10-CM

## 2014-01-26 LAB — COMPLETE METABOLIC PANEL WITH GFR
ALK PHOS: 96 U/L (ref 39–117)
ALT: 55 U/L — ABNORMAL HIGH (ref 0–53)
AST: 44 U/L — ABNORMAL HIGH (ref 0–37)
Albumin: 4 g/dL (ref 3.5–5.2)
BUN: 9 mg/dL (ref 6–23)
CO2: 26 meq/L (ref 19–32)
CREATININE: 0.61 mg/dL (ref 0.50–1.35)
Calcium: 8.9 mg/dL (ref 8.4–10.5)
Chloride: 104 mEq/L (ref 96–112)
Glucose, Bld: 132 mg/dL — ABNORMAL HIGH (ref 70–99)
Potassium: 4.1 mEq/L (ref 3.5–5.3)
Sodium: 138 mEq/L (ref 135–145)
Total Bilirubin: 0.4 mg/dL (ref 0.2–1.2)
Total Protein: 6.7 g/dL (ref 6.0–8.3)

## 2014-01-26 LAB — CBC WITH DIFFERENTIAL/PLATELET
Basophils Absolute: 0 10*3/uL (ref 0.0–0.1)
Basophils Relative: 1 % (ref 0–1)
Eosinophils Absolute: 0.2 10*3/uL (ref 0.0–0.7)
Eosinophils Relative: 5 % (ref 0–5)
HEMATOCRIT: 40.7 % (ref 39.0–52.0)
HEMOGLOBIN: 14.2 g/dL (ref 13.0–17.0)
LYMPHS ABS: 1.3 10*3/uL (ref 0.7–4.0)
Lymphocytes Relative: 36 % (ref 12–46)
MCH: 32.3 pg (ref 26.0–34.0)
MCHC: 34.9 g/dL (ref 30.0–36.0)
MCV: 92.7 fL (ref 78.0–100.0)
MONO ABS: 0.4 10*3/uL (ref 0.1–1.0)
MONOS PCT: 11 % (ref 3–12)
MPV: 10.4 fL (ref 9.4–12.4)
NEUTROS ABS: 1.7 10*3/uL (ref 1.7–7.7)
NEUTROS PCT: 47 % (ref 43–77)
Platelets: 235 10*3/uL (ref 150–400)
RBC: 4.39 MIL/uL (ref 4.22–5.81)
RDW: 12.9 % (ref 11.5–15.5)
WBC: 3.7 10*3/uL — ABNORMAL LOW (ref 4.0–10.5)

## 2014-01-26 LAB — RPR

## 2014-01-26 NOTE — Progress Notes (Signed)
  Subjective:    Patient ID: Curtis Burton, male    DOB: [birth year], 33 y.o.   MRN: [id number]  HPI   33 year old Hispanic man with recently diagnosed HIV AIDS CMV retinitis and  PCP pneumonia (latter dx made clinically).   He has been following with  Dr. Allyne GeeSanders for CMV. His HIV has been perfectly suppressed on Atripla but CD4 was  Been slow to climb. He has seen Dr. Allyne GeeSanders. [facility] and he is maintained healthy virological suppression and now his CD4 count is above 200.  Lab Results  Component Value Date   HIV1RNAQUANT <20 09/24/2013   Lab Results  Component Value Date   CD4TABS 250* 09/24/2013   CD4TABS 160* 04/30/2013   CD4TABS 160* 01/29/2013     Vision is stable.   Review of Systems  Constitutional: Negative for chills, diaphoresis, activity change, appetite change, fatigue and unexpected weight change.  HENT: Negative for rhinorrhea, sinus pressure, sneezing and trouble swallowing.   Eyes: Negative for photophobia.  Respiratory: Negative for cough, chest tightness, shortness of breath and stridor.   Cardiovascular: Negative for palpitations and leg swelling.  Gastrointestinal: Negative for constipation, blood in stool, abdominal distention and anal bleeding.  Genitourinary: Negative for dysuria, hematuria, flank pain and difficulty urinating.  Musculoskeletal: Negative for myalgias, back pain, joint swelling, arthralgias and gait problem.  Skin: Negative for color change, pallor and wound.  Neurological: Negative for dizziness, tremors, weakness and light-headedness.  Hematological: Negative for adenopathy. Does not bruise/bleed easily.  Psychiatric/Behavioral: Negative for behavioral problems, confusion, sleep disturbance, dysphoric mood, decreased concentration and agitation. The patient is not nervous/anxious.        Objective:   Physical Exam  Constitutional: He is oriented to person, place, and time. He appears well-developed and  well-nourished. No distress.  HENT:  Head: Normocephalic and atraumatic.  Mouth/Throat: Oropharynx is clear and moist. No oropharyngeal exudate.  Eyes: Conjunctivae and EOM are normal. No scleral icterus.  Neck: Normal range of motion. Neck supple. No JVD present.  Cardiovascular: Normal rate and regular rhythm.  Exam reveals friction rub.   Pulmonary/Chest: Effort normal. No respiratory distress. He has no wheezes.  Abdominal: He exhibits no distension.  Musculoskeletal: He exhibits no edema or tenderness.  Lymphadenopathy:    He has no cervical adenopathy.  Neurological: He is alert and oriented to person, place, and time. He has normal reflexes. He exhibits normal muscle tone. Coordination normal.  Skin: Skin is warm and dry. He is not diaphoretic. No erythema. No pallor.  Psychiatric: He has a normal mood and affect. His behavior is normal. Judgment and thought content normal.          Assessment & Plan:  HIV:  Continue STRIBILD and change to GENVOYA when approved by ADAP. RTC in 2 months to renew ADAP and recheck labs. I spent greater than 25 minutes with the patient including greater than 50% of time in face to face counsel of the patient and in coordination of their care with Engineer, structuralpanish translator.   CMV retinitis: discontinue his valganciclovir and followup with  Dr. Allyne GeeSanders.   PCP: continue daily Bactrim for PCP prophylaxis until 3 months consecutive CD4 count above 200

## 2014-01-26 NOTE — Patient Instructions (Signed)
You can stop your valcyte (valgancyclovir)  If your labs look really good today we can have you also stop the bactrim (but wait to hear from us)  We will get blood work today and in January

## 2014-01-27 ENCOUNTER — Other Ambulatory Visit: Payer: Self-pay | Admitting: Infectious Disease

## 2014-01-27 DIAGNOSIS — Z91199 Patient's noncompliance with other medical treatment and regimen due to unspecified reason: Secondary | ICD-10-CM

## 2014-01-27 DIAGNOSIS — B20 Human immunodeficiency virus [HIV] disease: Secondary | ICD-10-CM

## 2014-01-27 DIAGNOSIS — Z9119 Patient's noncompliance with other medical treatment and regimen: Secondary | ICD-10-CM

## 2014-01-27 LAB — T-HELPER CELL (CD4) - (RCID CLINIC ONLY)
CD4 % Helper T Cell: 11 % — ABNORMAL LOW (ref 33–55)
CD4 T CELL ABS: 130 /uL — AB (ref 400–2700)

## 2014-01-27 LAB — HIV-1 RNA QUANT-NO REFLEX-BLD
HIV 1 RNA QUANT: 61913 {copies}/mL — AB (ref <20)
HIV-1 RNA Quant, Log: 4.79 {Log} — ABNORMAL HIGH (ref <1.30)

## 2014-01-28 ENCOUNTER — Telehealth: Payer: Self-pay | Admitting: *Deleted

## 2014-01-28 NOTE — Telephone Encounter (Signed)
Lab orders were added via [facility], HIV genotype and Integrase genotype per Dr. Daiva EvesVan Dam.  Called phone numbers via [facility] [employer] Annell Greening(Jorge Carvajal, interpreter [phone]) to schedule an appointment, unable to reach patient. Primary number is not accepting calls, unable to leave a message. Secondary number was his boss's cell phone; he does not have contact information for patient, has not seen him for a while. Will try again.  Once I am able to get in touch with the patient, I will contact Dr. Allyne GeeSanders' office for an appointment.  Per Dr. Daiva EvesVan Dam, patient does not yet need to restart his valganciclovir as long as his CD4 is above 100.  _ Dr Zenaida NieceVan Dam's result notes: Unfortunately Wynetta EmeryUrbieta has failed, possibly with RESISTANCe. We need to add on HIV genotype and INtegrase genotype and get him back into clinic within a week. Something has gone horribly wrong and he also is going to need to have his eyes re-examined by his opthalmologist

## 2014-01-28 NOTE — Progress Notes (Signed)
Lab orders were added via [facility].  Called phone numbers via [employer] Annell Greening(Jorge Carvajal, interpreter [phone]), unable to reach patient.  Primary number is not accepting calls, unable to leave a message. Secondary number was his boss's cell phone; he does not have contact information for patient, has not seen him for a while.  Will try again. Andree CossHowell, Michelle M, RN

## 2014-01-28 NOTE — Telephone Encounter (Addendum)
Attempted to call patient's emergency contact, mother Marliss CzarGloria Urbieta.  No answer, mailbox has not yet been set up for voicemail.  Call was disconnected.  Used [employer], Denese Killingsduardo Martinez interpreter [phone].

## 2014-02-03 LAB — HIV-1 INTEGRASE GENOTYPE

## 2014-02-05 LAB — HIV-1 GENOTYPR PLUS

## 2014-03-04 ENCOUNTER — Other Ambulatory Visit (INDEPENDENT_AMBULATORY_CARE_PROVIDER_SITE_OTHER): Payer: Self-pay

## 2014-03-04 DIAGNOSIS — Z113 Encounter for screening for infections with a predominantly sexual mode of transmission: Secondary | ICD-10-CM

## 2014-03-04 DIAGNOSIS — B20 Human immunodeficiency virus [HIV] disease: Secondary | ICD-10-CM

## 2014-03-04 LAB — COMPLETE METABOLIC PANEL WITH GFR
ALT: 42 U/L (ref 0–53)
AST: 35 U/L (ref 0–37)
Albumin: 3.9 g/dL (ref 3.5–5.2)
Alkaline Phosphatase: 100 U/L (ref 39–117)
BILIRUBIN TOTAL: 0.4 mg/dL (ref 0.2–1.2)
BUN: 11 mg/dL (ref 6–23)
CO2: 25 meq/L (ref 19–32)
CREATININE: 0.56 mg/dL (ref 0.50–1.35)
Calcium: 9 mg/dL (ref 8.4–10.5)
Chloride: 102 mEq/L (ref 96–112)
GFR, Est African American: >89 mL/min
GFR, Est Non African American: >89 mL/min
Glucose, Bld: 158 mg/dL — ABNORMAL HIGH (ref 70–99)
POTASSIUM: 4 meq/L (ref 3.5–5.3)
SODIUM: 136 meq/L (ref 135–145)
Total Protein: 7 g/dL (ref 6.0–8.3)

## 2014-03-04 LAB — CBC WITH DIFFERENTIAL/PLATELET
Basophils Absolute: 0 10*3/uL (ref 0.0–0.1)
Basophils Relative: 0 % (ref 0–1)
EOS PCT: 2 % (ref 0–5)
Eosinophils Absolute: 0.1 10*3/uL (ref 0.0–0.7)
HCT: 40.6 % (ref 39.0–52.0)
Hemoglobin: 14.4 g/dL (ref 13.0–17.0)
LYMPHS ABS: 2.9 10*3/uL (ref 0.7–4.0)
LYMPHS PCT: 49 % — AB (ref 12–46)
MCH: 32.3 pg (ref 26.0–34.0)
MCHC: 35.5 g/dL (ref 30.0–36.0)
MCV: 91 fL (ref 78.0–100.0)
MONO ABS: 0.4 10*3/uL (ref 0.1–1.0)
MPV: 10.1 fL (ref 8.6–12.4)
Monocytes Relative: 7 % (ref 3–12)
Neutro Abs: 2.5 10*3/uL (ref 1.7–7.7)
Neutrophils Relative %: 42 % — ABNORMAL LOW (ref 43–77)
Platelets: 287 10*3/uL (ref 150–400)
RBC: 4.46 MIL/uL (ref 4.22–5.81)
RDW: 13.1 % (ref 11.5–15.5)
WBC: 6 10*3/uL (ref 4.0–10.5)

## 2014-03-04 LAB — RPR

## 2014-03-05 LAB — URINE CYTOLOGY ANCILLARY ONLY
Chlamydia: NEGATIVE
Neisseria Gonorrhea: NEGATIVE

## 2014-03-05 LAB — T-HELPER CELL (CD4) - (RCID CLINIC ONLY)
CD4 % Helper T Cell: 7 % — ABNORMAL LOW (ref 33–55)
CD4 T Cell Abs: 200 /uL — ABNORMAL LOW (ref 400–2700)

## 2014-03-05 LAB — HIV-1 RNA QUANT-NO REFLEX-BLD
HIV 1 RNA Quant: 8270 copies/mL — ABNORMAL HIGH (ref <20)
HIV-1 RNA Quant, Log: 3.92 {Log} — ABNORMAL HIGH (ref <1.30)

## 2014-03-18 ENCOUNTER — Ambulatory Visit (INDEPENDENT_AMBULATORY_CARE_PROVIDER_SITE_OTHER): Payer: Self-pay | Admitting: Infectious Disease

## 2014-03-18 ENCOUNTER — Observation Stay (HOSPITAL_COMMUNITY): Payer: Self-pay

## 2014-03-18 ENCOUNTER — Observation Stay (HOSPITAL_COMMUNITY)
Admission: AD | Admit: 2014-03-18 | Discharge: 2014-03-19 | Disposition: A | Discharge status: Home / Self Care | Payer: Self-pay | Source: Ambulatory Visit | Attending: Internal Medicine | Admitting: Internal Medicine

## 2014-03-18 ENCOUNTER — Encounter: Payer: Self-pay | Admitting: Infectious Disease

## 2014-03-18 ENCOUNTER — Encounter (HOSPITAL_COMMUNITY): Payer: Self-pay

## 2014-03-18 VITALS — BP 121/76 | HR 98 | Temp 98.4°F | Wt 159.8 lb

## 2014-03-18 DIAGNOSIS — B259 Cytomegaloviral disease, unspecified: Secondary | ICD-10-CM

## 2014-03-18 DIAGNOSIS — Z113 Encounter for screening for infections with a predominantly sexual mode of transmission: Secondary | ICD-10-CM

## 2014-03-18 DIAGNOSIS — F17210 Nicotine dependence, cigarettes, uncomplicated: Secondary | ICD-10-CM | POA: Insufficient documentation

## 2014-03-18 DIAGNOSIS — B59 Pneumocystosis: Secondary | ICD-10-CM

## 2014-03-18 DIAGNOSIS — B20 Human immunodeficiency virus [HIV] disease: Secondary | ICD-10-CM | POA: Diagnosis present

## 2014-03-18 DIAGNOSIS — R221 Localized swelling, mass and lump, neck: Secondary | ICD-10-CM

## 2014-03-18 DIAGNOSIS — Z9119 Patient's noncompliance with other medical treatment and regimen: Secondary | ICD-10-CM

## 2014-03-18 DIAGNOSIS — Z87440 Personal history of urinary (tract) infections: Secondary | ICD-10-CM | POA: Insufficient documentation

## 2014-03-18 DIAGNOSIS — H3090 Unspecified chorioretinal inflammation, unspecified eye: Secondary | ICD-10-CM | POA: Insufficient documentation

## 2014-03-18 DIAGNOSIS — Z91199 Patient's noncompliance with other medical treatment and regimen due to unspecified reason: Secondary | ICD-10-CM

## 2014-03-18 DIAGNOSIS — L0211 Cutaneous abscess of neck: Secondary | ICD-10-CM | POA: Insufficient documentation

## 2014-03-18 DIAGNOSIS — Z79899 Other long term (current) drug therapy: Secondary | ICD-10-CM | POA: Insufficient documentation

## 2014-03-18 DIAGNOSIS — L989 Disorder of the skin and subcutaneous tissue, unspecified: Secondary | ICD-10-CM

## 2014-03-18 DIAGNOSIS — D649 Anemia, unspecified: Secondary | ICD-10-CM | POA: Insufficient documentation

## 2014-03-18 LAB — CBC WITH DIFFERENTIAL/PLATELET
BASOS PCT: 0 % (ref 0–1)
Basophils Absolute: 0 10*3/uL (ref 0.0–0.1)
EOS ABS: 0.2 10*3/uL (ref 0.0–0.7)
EOS PCT: 2 % (ref 0–5)
HEMATOCRIT: 41.3 % (ref 39.0–52.0)
Hemoglobin: 14.6 g/dL (ref 13.0–17.0)
Lymphocytes Relative: 32 % (ref 12–46)
Lymphs Abs: 2.3 10*3/uL (ref 0.7–4.0)
MCH: 32.4 pg (ref 26.0–34.0)
MCHC: 35.4 g/dL (ref 30.0–36.0)
MCV: 91.8 fL (ref 78.0–100.0)
Monocytes Absolute: 0.4 10*3/uL (ref 0.1–1.0)
Monocytes Relative: 6 % (ref 3–12)
Neutro Abs: 4.1 10*3/uL (ref 1.7–7.7)
Neutrophils Relative %: 60 % (ref 43–77)
PLATELETS: 280 10*3/uL (ref 150–400)
RBC: 4.5 MIL/uL (ref 4.22–5.81)
RDW: 12.4 % (ref 11.5–15.5)
WBC: 7 10*3/uL (ref 4.0–10.5)

## 2014-03-18 LAB — COMPREHENSIVE METABOLIC PANEL
ALBUMIN: 3.9 g/dL (ref 3.5–5.2)
ALT: 41 U/L (ref 0–53)
AST: 40 U/L — AB (ref 0–37)
Alkaline Phosphatase: 98 U/L (ref 39–117)
Anion gap: 9 (ref 5–15)
BILIRUBIN TOTAL: 0.6 mg/dL (ref 0.3–1.2)
BUN: 7 mg/dL (ref 6–23)
CO2: 26 mmol/L (ref 19–32)
Calcium: 9.1 mg/dL (ref 8.4–10.5)
Chloride: 102 mEq/L (ref 96–112)
Creatinine, Ser: 0.71 mg/dL (ref 0.50–1.35)
GFR calc Af Amer: >90 mL/min (ref >90)
Glucose, Bld: 113 mg/dL — ABNORMAL HIGH (ref 70–99)
Potassium: 4.1 mmol/L (ref 3.5–5.1)
SODIUM: 137 mmol/L (ref 135–145)
Total Protein: 7.2 g/dL (ref 6.0–8.3)

## 2014-03-18 LAB — RPR

## 2014-03-18 MED ORDER — ONDANSETRON HCL 4 MG/2ML IJ SOLN: 4.0000 mg | Freq: Four times a day (QID) | INTRAMUSCULAR | Status: DC | PRN

## 2014-03-18 MED ORDER — ONDANSETRON HCL 4 MG PO TABS: 4.0000 mg | ORAL_TABLET | Freq: Four times a day (QID) | ORAL | Status: DC | PRN

## 2014-03-18 MED ORDER — SODIUM CHLORIDE 0.9 % IV SOLN: 250.0000 mL | INTRAVENOUS | Status: DC | PRN

## 2014-03-18 MED ORDER — POLYETHYLENE GLYCOL 3350 17 G PO PACK: 17.0000 g | PACK | Freq: Every day | ORAL | Status: DC | PRN

## 2014-03-18 MED ORDER — ACETAMINOPHEN 325 MG PO TABS: 650.0000 mg | ORAL_TABLET | Freq: Four times a day (QID) | ORAL | Status: DC | PRN

## 2014-03-18 MED ORDER — IOHEXOL 300 MG/ML  SOLN
100.0000 mL | Freq: Once | INTRAMUSCULAR | Status: AC | PRN
  Administered 2014-03-18: 100 mL via INTRAVENOUS

## 2014-03-18 MED ORDER — DARUNAVIR-COBICISTAT 800-150 MG PO TABS
1.0000 | ORAL_TABLET | Freq: Every day | ORAL | Status: DC
  Administered 2014-03-18 – 2014-03-19 (×2): 1 via ORAL
  Filled 2014-03-18 (×2): qty 1

## 2014-03-18 MED ORDER — ELVITEG-COBIC-EMTRICIT-TENOFAF 150-150-200-10 MG PO TABS: 1.0000 | ORAL_TABLET | Freq: Every day | ORAL | Status: DC

## 2014-03-18 MED ORDER — SODIUM CHLORIDE 0.9 % IJ SOLN
3.0000 mL | Freq: Two times a day (BID) | INTRAMUSCULAR | Status: DC
  Administered 2014-03-18 – 2014-03-19 (×3): 3 mL via INTRAVENOUS

## 2014-03-18 MED ORDER — EMTRICITABINE-TENOFOVIR DF 200-300 MG PO TABS: 1.0000 | ORAL_TABLET | Freq: Every day | ORAL | Status: DC

## 2014-03-18 MED ORDER — HYDROCODONE-ACETAMINOPHEN 5-325 MG PO TABS: 1.0000 | ORAL_TABLET | ORAL | Status: DC | PRN

## 2014-03-18 MED ORDER — SODIUM CHLORIDE 0.9 % IJ SOLN: 3.0000 mL | INTRAMUSCULAR | Status: DC | PRN

## 2014-03-18 MED ORDER — EMTRICITABINE-TENOFOVIR DF 200-300 MG PO TABS
1.0000 | ORAL_TABLET | Freq: Every day | ORAL | Status: DC
  Administered 2014-03-18 – 2014-03-19 (×2): 1 via ORAL
  Filled 2014-03-18 (×2): qty 1

## 2014-03-18 MED ORDER — DARUNAVIR-COBICISTAT 800-150 MG PO TABS: 1.0000 | ORAL_TABLET | Freq: Every day | ORAL | Status: DC

## 2014-03-18 MED ORDER — ACETAMINOPHEN 650 MG RE SUPP: 650.0000 mg | Freq: Four times a day (QID) | RECTAL | Status: DC | PRN

## 2014-03-18 MED ORDER — HEPARIN SODIUM (PORCINE) 5000 UNIT/ML IJ SOLN
5000.0000 [IU] | Freq: Three times a day (TID) | INTRAMUSCULAR | Status: DC
  Administered 2014-03-18 – 2014-03-19 (×3): 5000 [IU] via SUBCUTANEOUS
  Filled 2014-03-18 (×3): qty 1

## 2014-03-18 NOTE — Progress Notes (Signed)
Subjective:    Patient ID: Curtis Burton, male    DOB: [birth year], 34 y.o.   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   He has been following with  Dr. Allyne Gee for CMV. His HIV had been perfectly suppressed on Atripla but CD4 was  Been slow to climb. He has seen Dr. Allyne Gee. We switched him to [facility] and he is maintained healthy virological suppression and now his CD4 count is above 200 but then has had virological failure when checked in November with 184 V, viral load did drop further since then but I have concern he may have developed INI resistance in the interim. CD4 had dropped below 200 but now back above 200.  Lab Results  Component Value Date   HIV1RNAQUANT 8270* 03/04/2014   Lab Results  Component Value Date   CD4TABS 200* 03/04/2014   CD4TABS 130* 01/26/2014   CD4TABS 250* 09/24/2013   Finally he has developed a pink raised tender cystic lesion on neck that has been present for 3 weeks no drainage or fevers.  Review of Systems  Constitutional: Negative for chills, diaphoresis, activity change, appetite change, fatigue and unexpected weight change.  HENT: Negative for rhinorrhea, sinus pressure, sneezing and trouble swallowing.   Eyes: Negative for photophobia.  Respiratory: Negative for cough, chest tightness, shortness of breath and stridor.   Cardiovascular: Negative for palpitations and leg swelling.  Gastrointestinal: Negative for constipation, blood in stool, abdominal distention and anal bleeding.  Genitourinary: Negative for dysuria, hematuria, flank pain and difficulty urinating.  Musculoskeletal: Negative for myalgias, back pain, joint swelling, arthralgias and gait problem.  Skin: Negative for color change, pallor and wound.  Neurological: Negative for dizziness, tremors, weakness and light-headedness.  Hematological: Negative for adenopathy. Does  not bruise/bleed easily.  Psychiatric/Behavioral: Negative for behavioral problems, confusion, sleep disturbance, dysphoric mood, decreased concentration and agitation. The patient is not nervous/anxious.        Objective:   Physical Exam  Constitutional: He is oriented to person, place, and time. He appears well-developed and well-nourished. No distress.  HENT:  Head: Normocephalic and atraumatic.  Mouth/Throat: Oropharynx is clear and moist. No oropharyngeal exudate.  Eyes: Conjunctivae and EOM are normal. No scleral icterus.  Neck: Normal range of motion. Neck supple. No JVD present.  Cardiovascular: Normal rate and regular rhythm.   Pulmonary/Chest: Effort normal. No respiratory distress. He has no wheezes.  Abdominal: He exhibits no distension.  Musculoskeletal: He exhibits no edema or tenderness.  Lymphadenopathy:    He has no cervical adenopathy.  Neurological: He is alert and oriented to person, place, and time. He exhibits normal muscle tone. Coordination normal.  Skin: Skin is warm and dry. He is not diaphoretic. No erythema. No pallor.  Psychiatric: He has a normal mood and affect. His behavior is normal. Judgment and thought content normal.    Pink lesion on neck that is raised and tender, ? Overlying major vein         Assessment & Plan:  HIV:  Recheck VL with INI genotype and conventional genotype, change to PREZCOBIX and Truvada  I spent greater than 25 minutes with the patient including greater than 50% of time in face to face counsel of the patient and in coordination of their care.   Nck lesion: I will admit him to the hospital for CT scan of neck to ensure no deep structures that are involved.  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 Allyne GeeSanders.   PCP: continue daily Bactrim for PCP prophylaxis until 3  months consecutive CD4 count above 200 consistently

## 2014-03-18 NOTE — H&P (Signed)
[facility] History and Physical  Jacorion Klem ZOX:[id number] DOB: 06-May-1980 DOA: 03/18/2014  Referring physician: Dr. Daiva Eves PCP: Default, Provider, MD   Chief Complaint: neck nodule  HPI: Maxi Rodas is a 34 y.o. male Hispanic male with past medical history of HIV with last CD4 count greater than 200, CMV retinitis and PCP pneumonia did notice a nodule in the neck about 2 weeks prior to admission, is not erythematous or warm to touch. He relates this started out as a hard nodule now is soft. He relates no pain no fevers.  Review of Systems:  Constitutional:  No weight loss, night sweats, Fevers, chills, fatigue.  Cardio-vascular:  No chest pain, Orthopnea, PND, swelling in lower extremities, anasarca, dizziness, palpitations  GI:  No heartburn, indigestion, abdominal pain, nausea, vomiting, diarrhea, change in bowel habits, loss of appetite  Resp:  No shortness of breath with exertion or at rest. No excess mucus, no productive cough, No non-productive cough, No coughing up of blood.No change in color of mucus.No wheezing.No chest wall deformity  Skin:  no rash or lesions.  GU:  no dysuria, change in color of urine, no urgency or frequency. No flank pain.  Musculoskeletal:  No joint pain or swelling. No decreased range of motion. No back pain.  Psych:  No change in mood or affect. No depression or anxiety. No memory loss.   Past Medical History  Diagnosis Date  . UTI (urinary tract infection)   . Retinitis     PERIFERAL FOCAL  . Anemia   . HIV infection    Past Surgical History  Procedure Laterality Date  . No past surgeries     Social History:  reports that he has been smoking Cigarettes.  He has a 3.75 pack-year smoking history. He has never used smokeless tobacco. He reports that he drinks about 1.2 oz of alcohol per week. He reports that he does not use illicit drugs.  No Known Allergies  No family history on file.   Prior to  Admission medications   Medication Sig Start Date End Date Taking? Authorizing Provider  darunavir-cobicistat (PREZCOBIX) 800-150 MG per tablet Take 1 tablet by mouth daily. 03/18/14   Randall Hiss, MD  emtricitabine-tenofovir (TRUVADA) 200-300 MG per tablet Take 1 tablet by mouth daily. 03/18/14   Randall Hiss, MD  sulfamethoxazole-trimethoprim (BACTRIM DS) 800-160 MG per tablet Take 1 tablet by mouth once. Patient not taking: Reported on 03/18/2014 03/27/13   Randall Hiss, MD  traZODone (DESYREL) 50 MG tablet Take 1 tablet (50 mg total) by mouth at bedtime. Patient not taking: Reported on 03/18/2014 04/29/13   Randall Hiss, MD   Physical Exam: Filed Vitals:   03/18/14 1147  BP: 118/70  Pulse: 91  Temp: 98.2 F (36.8 C)  TempSrc: Oral  Resp: 20  SpO2: 98%    Wt Readings from Last 3 Encounters:  03/18/14 72.462 kg (159 lb 12 oz)  01/26/14 71.895 kg (158 lb 8 oz)  09/24/13 68.947 kg (152 lb)    General:  Appears calm and comfortable Eyes: PERRL, normal lids, irises & conjunctiva ENT: There is a 2 cm fluctuation in the neck not warm or erythematous to touch doesn't seem fix to the underlying muscle. Neck: no LAD, masses or thyromegaly Cardiovascular: RRR, no m/r/g. No LE edema. Respiratory: CTA bilaterally. Normal respiratory effort. Abdomen: soft, ntnd Skin: no rash or induration seen on limited exam Musculoskeletal: grossly normal tone BUE/BLE Psychiatric: grossly  normal mood and affect, speech fluent and appropriate Neurologic: grossly non-focal.          Labs on Admission:  Basic Metabolic Panel:  Recent Labs Lab 03/18/14 1005  NA 137  K 4.1  CL 102  CO2 26  GLUCOSE 113*  BUN 7  CREATININE 0.71  CALCIUM 9.1   Liver Function Tests:  Recent Labs Lab 03/18/14 1005  AST 40*  ALT 41  ALKPHOS 98  BILITOT 0.6  PROT 7.2  ALBUMIN 3.9   No results for input(s): LIPASE, AMYLASE in the last 168 hours. No results for input(s): AMMONIA in  the last 168 hours. CBC:  Recent Labs Lab 03/18/14 1005  WBC 7.0  NEUTROABS 4.1  HGB 14.6  HCT 41.3  MCV 91.8  PLT 280   Cardiac Enzymes: No results for input(s): CKTOTAL, CKMB, CKMBINDEX, TROPONINI in the last 168 hours.  BNP (last 3 results) No results for input(s): PROBNP in the last 8760 hours. CBG: No results for input(s): GLUCAP in the last 168 hours.  Radiological Exams on Admission: No results found.  EKG: Independently reviewed. none  Assessment/Plan AIDS (acquired immunodeficiency syndrome), CD4 <=200: - Continue his HIV medication, bactrim  Neck nodule: - Check CT scan of the neck to rule out abscesses, when sure no deep structures are involved.will consult ENT depending on result and probably somersault for pathology, culture AFB and fungal cultures.   Code Status: full DVT Prophylaxis:heparin Family Communication: wife Disposition Plan: observation  Time spent: 60 minutes  FELIZ Rosine BeatORTIZ, ABRAHAM [facility] Pager [phone]

## 2014-03-18 NOTE — Consult Note (Signed)
Reason for Consult:right neck mass Referring Physician: ID  Lael Pilch is an 34 y.o. male.  HPI: Hx of 2 weeks of a right neck nodule that started small. No injury. No insect bites. He has HIV. He has been on abx a month ago for his eye but no treatment according to family in the last month. No dysphagia or odynophagia. He had a CT scan today but no results available yet.   Past Medical History  Diagnosis Date  . UTI (urinary tract infection)   . Retinitis     PERIFERAL FOCAL  . Anemia   . HIV infection     Past Surgical History  Procedure Laterality Date  . No past surgeries      No family history on file.  Social History:  reports that he has been smoking Cigarettes.  He has a 3.75 pack-year smoking history. He has never used smokeless tobacco. He reports that he drinks about 1.2 oz of alcohol per week. He reports that he does not use illicit drugs.  Allergies: No Known Allergies  Medications: I have reviewed the patient's current medications.  Results for orders placed or performed in visit on 03/18/14 (from the past 48 hour(s))  RPR     Status: None   Collection Time: 03/18/14 10:04 AM  Result Value Ref Range   RPR Ser Ql NON REAC NON REAC  CBC with Differential     Status: None   Collection Time: 03/18/14 10:05 AM  Result Value Ref Range   WBC 7.0 4.0 - 10.5 K/uL   RBC 4.50 4.22 - 5.81 MIL/uL   Hemoglobin 14.6 13.0 - 17.0 g/dL   HCT 41.3 39.0 - 52.0 %   MCV 91.8 78.0 - 100.0 fL   MCH 32.4 26.0 - 34.0 pg   MCHC 35.4 30.0 - 36.0 g/dL   RDW 12.4 11.5 - 15.5 %   Platelets 280 150 - 400 K/uL   Neutrophils Relative % 60 43 - 77 %   Neutro Abs 4.1 1.7 - 7.7 K/uL   Lymphocytes Relative 32 12 - 46 %   Lymphs Abs 2.3 0.7 - 4.0 K/uL   Monocytes Relative 6 3 - 12 %   Monocytes Absolute 0.4 0.1 - 1.0 K/uL   Eosinophils Relative 2 0 - 5 %   Eosinophils Absolute 0.2 0.0 - 0.7 K/uL   Basophils Relative 0 0 - 1 %   Basophils Absolute 0.0 0.0 - 0.1 K/uL   Comprehensive metabolic panel     Status: Abnormal   Collection Time: 03/18/14 10:05 AM  Result Value Ref Range   Sodium 137 135 - 145 mmol/L    Comment: Please note change in reference range.   Potassium 4.1 3.5 - 5.1 mmol/L    Comment: Please note change in reference range.   Chloride 102 96 - 112 mEq/L   CO2 26 19 - 32 mmol/L   Glucose, Bld 113 (H) 70 - 99 mg/dL   BUN 7 6 - 23 mg/dL   Creatinine, Ser 0.71 0.50 - 1.35 mg/dL   Calcium 9.1 8.4 - 10.5 mg/dL   Total Protein 7.2 6.0 - 8.3 g/dL   Albumin 3.9 3.5 - 5.2 g/dL   AST 40 (H) 0 - 37 U/L   ALT 41 0 - 53 U/L   Alkaline Phosphatase 98 39 - 117 U/L   Total Bilirubin 0.6 0.3 - 1.2 mg/dL   GFR calc non Af Amer >90 >90 mL/min   GFR calc Af  Amer >90 >90 mL/min    Comment: (NOTE) The eGFR has been calculated using the CKD EPI equation. This calculation has not been validated in all clinical situations. eGFR's persistently <90 mL/min signify possible Chronic Kidney Disease.    Anion gap 9 5 - 15    No results found.  ROS Blood pressure 120/69, pulse 80, temperature 97.9 F (36.6 C), temperature source Oral, resp. rate 20, SpO2 100 %. Physical Exam  Constitutional: He appears well-developed and well-nourished.  HENT:  Right lower neck with 2 cm mobile nontender erythematous nodule that may have some fluid. It seems superficial.    Assessment/Plan: Right superficial neck nodule- it will be up to ID but I would think treating with some Abx first then intervention.  It does not have much surrounding cellulitis or edema. Could be a tumor so It actually would be best to FNA first to aspirate then I/D if no DX. The FNA could be done by radiology or in my office as outpatient.   Melissa Montane 03/18/2014, 5:49 PM

## 2014-03-18 NOTE — Patient Instructions (Signed)
We need to admit you for a CT scan of your neck and Surgical consultation

## 2014-03-18 NOTE — Progress Notes (Signed)
Patient ID: Jearl KlinefelterMiguel Rodriguez Urbieta, male   DOB: September 26, 1980, 34 y.o.   MRN: [id number] HPI: Jearl KlinefelterMiguel Rodriguez Urbieta is a 34 y.o. male who is here with his wife for f/u of HIV.  Allergies: No Known Allergies  Vitals: Temp: 98.4 F (36.9 C) (01/20 0920) Temp Source: Oral (01/20 0920) BP: 121/76 mmHg (01/20 0920) Pulse Rate: 98 (01/20 0920)  Past Medical History: Past Medical History  Diagnosis Date  . UTI (urinary tract infection)   . Retinitis     PERIFERAL FOCAL  . Anemia   . HIV infection     Social History: History   Social History  . Marital Status: Single    Spouse Name: N/A    Number of Children: N/A  . Years of Education: N/A   Social History Main Topics  . Smoking status: Current Some Day Smoker -- 0.25 packs/day for 15 years    Types: Cigarettes  . Smokeless tobacco: Never Used     Comment: slowing down on ciggs  . Alcohol Use: 1.2 oz/week    2 Shots of liquor per week     Comment: weekends  . Drug Use: No  . Sexual Activity:    Partners: Female     Comment: pt declined condoms   Other Topics Concern  . None   Social History Narrative    Previous Regimen: Atripla  Current Regimen: Stribild  Labs: HIV 1 RNA QUANT (copies/mL)  Date Value  03/04/2014 8270*  01/26/2014 61913*  09/24/2013 <20   CD4 T CELL ABS (/uL)  Date Value  03/04/2014 200*  01/26/2014 130*  09/24/2013 250*   HEPATITIS B SURFACE AG (no units)  Date Value  03/08/2012 NEGATIVE   HCV AB (no units)  Date Value  03/08/2012 NEGATIVE    CrCl: Estimated Creatinine Clearance: 117.6 mL/min (by C-G formula based on Cr of 0.56).  Lipids:    Component Value Date/Time   CHOL 155 04/30/2013 0906   TRIG 76 04/30/2013 0906   HDL 43 04/30/2013 0906   CHOLHDL 3.6 04/30/2013 0906   VLDL 15 04/30/2013 0906   LDLCALC 97 04/30/2013 0906    Assessment: 34 yo who is here with his wife for his HIV f/u. Noticed that his VL is still up on stribild. 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 This could very well explained the partial activity to Stribild. D/w Dr. Daiva EvesVan Dam and we are going to use Prezcobix/Truvada now. PI based regimen will be much more stable against m184v. His wife was able to translate that to him. He also developed some cyst on this neck that has been there for a couple of weeks. Dr Daiva EvesVan Dam will sent him to inpt to see if it can be drained.   Recommendations: Dc Stribild Start Prezcobix 1 PO qday Start Truvada 1 PO qday F/u VL  Clide CliffPham, Minh Quang, PharmD Clinical Infectious Disease Pharmacist [facility] 03/18/2014, 10:30 AM

## 2014-03-19 ENCOUNTER — Encounter (HOSPITAL_COMMUNITY): Payer: Self-pay | Admitting: General Practice

## 2014-03-19 ENCOUNTER — Other Ambulatory Visit (HOSPITAL_COMMUNITY): Payer: Self-pay

## 2014-03-19 ENCOUNTER — Observation Stay (HOSPITAL_COMMUNITY): Payer: MEDICAID

## 2014-03-19 ENCOUNTER — Other Ambulatory Visit: Payer: Self-pay | Admitting: *Deleted

## 2014-03-19 DIAGNOSIS — B20 Human immunodeficiency virus [HIV] disease: Secondary | ICD-10-CM

## 2014-03-19 DIAGNOSIS — L0211 Cutaneous abscess of neck: Secondary | ICD-10-CM | POA: Insufficient documentation

## 2014-03-19 DIAGNOSIS — R221 Localized swelling, mass and lump, neck: Principal | ICD-10-CM

## 2014-03-19 LAB — BASIC METABOLIC PANEL
Anion gap: 6 (ref 5–15)
BUN: 10 mg/dL (ref 6–23)
CO2: 27 mmol/L (ref 19–32)
CREATININE: 0.71 mg/dL (ref 0.50–1.35)
Calcium: 8.6 mg/dL (ref 8.4–10.5)
Chloride: 104 mEq/L (ref 96–112)
Glucose, Bld: 111 mg/dL — ABNORMAL HIGH (ref 70–99)
Potassium: 3.9 mmol/L (ref 3.5–5.1)
Sodium: 137 mmol/L (ref 135–145)

## 2014-03-19 LAB — CBC
HEMATOCRIT: 41.4 % (ref 39.0–52.0)
HEMOGLOBIN: 14.2 g/dL (ref 13.0–17.0)
MCH: 32.1 pg (ref 26.0–34.0)
MCHC: 34.3 g/dL (ref 30.0–36.0)
MCV: 93.7 fL (ref 78.0–100.0)
Platelets: 252 10*3/uL (ref 150–400)
RBC: 4.42 MIL/uL (ref 4.22–5.81)
RDW: 12.5 % (ref 11.5–15.5)
WBC: 5.3 10*3/uL (ref 4.0–10.5)

## 2014-03-19 LAB — PROTIME-INR
INR: 0.97 (ref 0.00–1.49)
Prothrombin Time: 13 seconds (ref 11.6–15.2)

## 2014-03-19 LAB — T-HELPER CELL (CD4) - (RCID CLINIC ONLY)
CD4 % Helper T Cell: 10 % — ABNORMAL LOW (ref 33–55)
CD4 T CELL ABS: 260 /uL — AB (ref 400–2700)

## 2014-03-19 LAB — URINE CYTOLOGY ANCILLARY ONLY
CHLAMYDIA, DNA PROBE: NEGATIVE
Neisseria Gonorrhea: NEGATIVE

## 2014-03-19 MED ORDER — DARUNAVIR-COBICISTAT 800-150 MG PO TABS: 1.0000 | ORAL_TABLET | Freq: Every day | ORAL | Status: DC

## 2014-03-19 MED ORDER — MIDAZOLAM HCL 2 MG/2ML IJ SOLN
INTRAMUSCULAR | Status: AC
  Filled 2014-03-19: qty 2

## 2014-03-19 MED ORDER — MIDAZOLAM HCL 2 MG/2ML IJ SOLN
INTRAMUSCULAR | Status: AC | PRN
  Administered 2014-03-19: 1 mg via INTRAVENOUS

## 2014-03-19 MED ORDER — HEPARIN SODIUM (PORCINE) 5000 UNIT/ML IJ SOLN: 5000.0000 [IU] | Freq: Three times a day (TID) | INTRAMUSCULAR | Status: DC

## 2014-03-19 MED ORDER — DOXYCYCLINE HYCLATE 50 MG PO CAPS: 100.0000 mg | ORAL_CAPSULE | Freq: Two times a day (BID) | ORAL | Status: DC

## 2014-03-19 MED ORDER — EMTRICITABINE-TENOFOVIR DF 200-300 MG PO TABS
1.0000 | ORAL_TABLET | Freq: Every day | ORAL | Status: DC
Start: 2014-03-19 — End: 2014-10-15

## 2014-03-19 MED ORDER — DOXYCYCLINE HYCLATE 100 MG PO TABS: 100.0000 mg | ORAL_TABLET | Freq: Two times a day (BID) | ORAL | Status: DC

## 2014-03-19 MED ORDER — FENTANYL CITRATE 0.05 MG/ML IJ SOLN
INTRAMUSCULAR | Status: AC | PRN
  Administered 2014-03-19: 50 ug via INTRAVENOUS

## 2014-03-19 MED ORDER — FENTANYL CITRATE 0.05 MG/ML IJ SOLN
INTRAMUSCULAR | Status: AC
  Filled 2014-03-19: qty 2

## 2014-03-19 MED ORDER — LIDOCAINE HCL (PF) 1 % IJ SOLN
INTRAMUSCULAR | Status: AC
  Filled 2014-03-19: qty 10

## 2014-03-19 NOTE — Telephone Encounter (Signed)
ADAP Application 

## 2014-03-19 NOTE — Progress Notes (Signed)
UR completed 

## 2014-03-19 NOTE — Sedation Documentation (Signed)
Patient denies pain and is resting comfortably.  

## 2014-03-19 NOTE — Progress Notes (Signed)
Discharge orders received.  Discharge instructions and follow-up appointments reviewed with the patient and his wife.  Prescription given to pt's wife.  VSS upon discharge.  IV removed,questions answered and education complete.  Pt walked out to main entrance.   Sondra ComeSilva, Emina Ribaudo M, RN

## 2014-03-19 NOTE — Progress Notes (Signed)
[facility] PROGRESS NOTE  Assessment/Plan: Lesion of neck - CT of neck as below, consulted ENT. - will get IR to get Fine needle aspiration. - cont to hold antibiotics pt is not septic appearing. - sitting in bed with no complains.  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    Code Status: full Family Communication: wife  Disposition Plan: observation   Consultants:  ENT Dr. Jearld Fentonbyers  Procedures: CT neck 1.21.2016: neck lesion measuring as long as 3.4 cm. Most likely this represents inflammation/small abscess  Antibiotics:  None  HPI/Subjective: No compalins, except hungry  Objective: Filed Vitals:   03/18/14 1147 03/18/14 1733 03/18/14 2100 03/19/14 0500  BP: 118/70 120/69 112/64 113/60  Pulse: 91 80 79 65  Temp: 98.2 F (36.8 C) 97.9 F (36.6 C) 98.1 F (36.7 C) 98.1 F (36.7 C)  TempSrc: Oral Oral Oral Oral  Resp: 20 20 20 20   SpO2: 98% 100% 98% 100%    Intake/Output Summary (Last 24 hours) at 03/19/14 0834 Last data filed at 03/18/14 1900  Gross per 24 hour  Intake    240 ml  Output      0 ml  Net    240 ml   There were no vitals filed for this visit.  Exam:  General: Alert, awake, oriented x3, in no acute distress.  HEENT: No bruits, no goiter.  Heart: Regular rate and rhythm. Lungs: Good air movement, clear  Abdomen: Soft, nontender, nondistended, positive bowel sounds.   Data Reviewed: Basic Metabolic Panel:  Recent Labs Lab 03/18/14 1005 03/19/14 0534  NA 137 137  K 4.1 3.9  CL 102 104  CO2 26 27  GLUCOSE 113* 111*  BUN 7 10  CREATININE 0.71 0.71  CALCIUM 9.1 8.6   Liver Function Tests:  Recent Labs Lab 03/18/14 1005  AST 40*  ALT 41  ALKPHOS 98  BILITOT 0.6  PROT 7.2  ALBUMIN 3.9   No results for input(s): LIPASE, AMYLASE in the last 168 hours. No results for input(s): AMMONIA in the last 168 hours. CBC:  Recent Labs Lab 03/18/14 1005 03/19/14 0534  WBC 7.0 5.3    NEUTROABS 4.1  --   HGB 14.6 14.2  HCT 41.3 41.4  MCV 91.8 93.7  PLT 280 252   Cardiac Enzymes: No results for input(s): CKTOTAL, CKMB, CKMBINDEX, TROPONINI in the last 168 hours. BNP (last 3 results) No results for input(s): PROBNP in the last 8760 hours. CBG: No results for input(s): GLUCAP in the last 168 hours.  No results found for this or any previous visit (from the past 240 hour(s)).   Studies: Ct Soft Tissue Neck W Contrast  03/18/2014   CLINICAL DATA:  34 year old male with a history of right neck mass. No additional medical history provided.  EXAM: CT NECK WITH CONTRAST  TECHNIQUE: Multidetector CT imaging of the neck was performed using the standard protocol following the bolus administration of intravenous contrast.  CONTRAST:  100mL OMNIPAQUE IOHEXOL 300 MG/ML  SOLN  COMPARISON:  None.  FINDINGS: Skull base:  Unremarkable appearance of the soft tissues of the skullbase.  Visualized orbits unremarkable.  Unremarkable appearance of the visualized supra zygomatic masticator space and infra zygomatic masticator space.  Small bilateral lymph nodes of the parotid glands which are otherwise unremarkable.  Parapharyngeal fat maintained. Fullness of the tonsillar tissues with no focal mass identified.  The left and right carotid space are maintained, with maintained fat planes and architecture. No mass identified.  Unremarkable appearance of the retropharyngeal space and the deep cervical space of the neck.  Unremarkable appearance of the visualized intracranial structures.  Relatively unremarkable appearance of the pharyngeal mucosa the supraglottic and infraglottic regions. Airway patency maintained.  There are lymph nodes present along the bilateral jugular/cervical nodal stations, none of which measure enlarged by CT size criteria. The largest measures 11 mm on the right at the angle of the mandible (image 38 of series 201). There are numerous bilateral lymph nodes present. Lymph nodes  extend to the supraclavicular region and into the mediastinum. Superior right peritracheal node measures 11 mm on image 99 of series 2 will 1.  Underlying the fiducial at the base of the right neck there is a low-density fluid collection extending from the skin surface into the deep soft tissues, crossing the tissue planes and terminating deep to the right external jugular vein. There appears to be rim enhancement, with the greatest diameter measuring 3.4 cm and transverse diameter measuring approximately 10 mm. The fluid is posterior to the right sternocleidomastoid, and inseparable from the muscle belly.  Evidence of endodontal disease with dental caries eroding the posterior left and right maxillary molars.  Vascular:  Unremarkable appearance of the aortic arch and branch vessels. Bilateral carotid system are unremarkable without significant plaque. Bilateral vertebral arteries are patent.  Unremarkable appearance of the bilateral jugular veins which are patent with no thrombus.  Unremarkable appearance of the thyroid tissue.  No acute bony abnormality identified.  Mild degenerative disc disease of the cervical spine.  IMPRESSION: In the region of interest at the right neck base there is a low-density rim enhancing fluid collection extending from the skin surface deep to the sternocleidomastoid and right external jugular vein measuring as long as 3.4 cm. Most likely this represents inflammation/small abscess, though also included in the differential would be a vascular malformation, which could potentially be secondarily inflamed/infected.  Multiple lymph nodes of the head and neck, none of which are enlarged by CT size criteria and are likely reactive. Correlation the patient's symptoms and presentation may be useful.  Dental disease, with dental caries eroding the left and right posterior axillary molar teeth.  Signed,  Yvone Neu. Loreta Ave, DO  [facility]  [facility]    Electronically Signed   By: Gilmer Mor D.O.   On: 03/18/2014 18:09    Scheduled Meds: . darunavir-cobicistat  1 tablet Oral Daily  . emtricitabine-tenofovir  1 tablet Oral Daily  . heparin  5,000 Units Subcutaneous 3 times per day  . sodium chloride  3 mL Intravenous Q12H   Continuous Infusions:    Marinda Elk  [facility] Pager [phone]. If 8PM-8AM, please contact night-coverage at [email or web address], password [email or web address] 03/19/2014, 8:34 AM  LOS: 1 day

## 2014-03-19 NOTE — Consult Note (Addendum)
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 male with HIV/AIDS c/b cmv retinitis and pcp pneumonia, CD 4 count 260(10)/ VL 8,260 (jan 2016). 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 He was seen in ID clinic with dr. Lucianne Lei dam on 1/20 where he complained of having raised cystic, tender lesion to right base of neck for the past 3 wks not draining and denies fever. He was admitted from clinic for work up/ management of acute lesion. He underwent neck ct in addition to evaluation by ENT who recommended FNA to be sent for cytology, path, and culture to determine if infectious vs. Malignancy. He is now being evaluated by IR for FNA. Wife reported that the lesions started out small and firm, slightly tender increasing in size and becoming softer in the center.  In regards to his hiv medication adherence. He takes it daily, although, he reports missing 2 doses back to back last month  Past Medical History  Diagnosis Date  . UTI (urinary tract infection)   . Retinitis     PERIFERAL FOCAL  . Anemia   . HIV infection     Allergies: No Known Allergies  MEDICATIONS: . darunavir-cobicistat  1 tablet Oral Daily  . emtricitabine-tenofovir  1 tablet Oral Daily  . fentaNYL      . [START ON 03/20/2014] heparin  5,000 Units Subcutaneous 3 times per day  . lidocaine (PF)      . midazolam      . sodium chloride  3 mL Intravenous Q12H    History  Substance Use Topics  . Smoking status: Current Some Day Smoker -- 0.25 packs/day for 15 years    Types: Cigarettes  . Smokeless tobacco: Never Used     Comment: slowing down on ciggs  . Alcohol Use: 1.2 oz/week    2 Shots of liquor per week     Comment:  weekends    History reviewed. No pertinent family history.  Review of Systems  Constitutional: Negative for fever, chills, diaphoresis, activity change, appetite change, fatigue and unexpected weight change.  HENT: Negative for congestion, sore throat, rhinorrhea, sneezing, trouble swallowing and sinus pressure.  Eyes: Negative for photophobia and visual disturbance.  Respiratory: Negative for cough, chest tightness, shortness of breath, wheezing and stridor.  Cardiovascular: Negative for chest pain, palpitations and leg swelling.  Gastrointestinal: Negative for nausea, vomiting, abdominal pain, diarrhea, constipation, blood in stool, abdominal distention and anal bleeding.  Genitourinary: Negative for dysuria, hematuria, flank pain and difficulty urinating.  Musculoskeletal: Negative for myalgias, back pain, joint swelling, arthralgias and gait problem.  Skin: neck lesion Neurological: Negative for dizziness, tremors, weakness and light-headedness.  Hematological: Negative for adenopathy. Does not bruise/bleed easily.  Psychiatric/Behavioral: Negative for behavioral problems, confusion, sleep disturbance, dysphoric mood, decreased concentration and agitation.     OBJECTIVE: Temp:  [97.5 F (36.4 C)-98.1 F (36.7 C)] 97.7 F (36.5 C) (01/21 1511) Pulse Rate:  [62-80] 71 (01/21 1522) Resp:  [13-20] 13 (01/21 1522) BP: (109-121)/(60-69) 121/67 mmHg (01/21 1522) SpO2:  [96 %-100 %] 96 % (01/21 1522) Physical Exam  Constitutional: He is oriented to person,  place, and time. He appears well-developed and well-nourished. No distress.  HENT: right neck mass 2 cm firm no surrounding erythema, mildly tender Mouth/Throat: Oropharynx is clear and moist. No oropharyngeal exudate.  Cardiovascular: Normal rate, regular rhythm and normal heart sounds. Exam reveals no gallop and no friction rub.  No murmur heard.  Pulmonary/Chest: Effort normal and breath sounds normal. No respiratory distress. He  has no wheezes.  Abdominal: Soft. Bowel sounds are normal. He exhibits no distension. There is no tenderness.  Lymphadenopathy:  He has no cervical adenopathy.  Neurological: He is alert and oriented to person, place, and time.  Skin: Skin is warm and dry. No rash noted. No erythema.  Psychiatric: He has a normal mood and affect. His behavior is normal.    LABS: Results for orders placed or performed during the hospital encounter of 03/18/14 (from the past 48 hour(s))  Basic metabolic panel     Status: Abnormal   Collection Time: 03/19/14  5:34 AM  Result Value Ref Range   Sodium 137 135 - 145 mmol/L    Comment: Please note change in reference range.   Potassium 3.9 3.5 - 5.1 mmol/L    Comment: Please note change in reference range.   Chloride 104 96 - 112 mEq/L   CO2 27 19 - 32 mmol/L   Glucose, Bld 111 (H) 70 - 99 mg/dL   BUN 10 6 - 23 mg/dL   Creatinine, Ser 0.71 0.50 - 1.35 mg/dL   Calcium 8.6 8.4 - 10.5 mg/dL   GFR calc non Af Amer >90 >90 mL/min   GFR calc Af Amer >90 >90 mL/min    Comment: (NOTE) The eGFR has been calculated using the CKD EPI equation. This calculation has not been validated in all clinical situations. eGFR's persistently <90 mL/min signify possible Chronic Kidney Disease.    Anion gap 6 5 - 15  CBC     Status: None   Collection Time: 03/19/14  5:34 AM  Result Value Ref Range   WBC 5.3 4.0 - 10.5 K/uL   RBC 4.42 4.22 - 5.81 MIL/uL   Hemoglobin 14.2 13.0 - 17.0 g/dL   HCT 41.4 39.0 - 52.0 %   MCV 93.7 78.0 - 100.0 fL   MCH 32.1 26.0 - 34.0 pg   MCHC 34.3 30.0 - 36.0 g/dL   RDW 12.5 11.5 - 15.5 %   Platelets 252 150 - 400 K/uL  Protime-INR     Status: None   Collection Time: 03/19/14  5:34 AM  Result Value Ref Range   Prothrombin Time 13.0 11.6 - 15.2 seconds   INR 0.97 0.00 - 1.49    MICRO:  IMAGING: Ct Soft Tissue Neck W Contrast  03/19/2014   ADDENDUM REPORT: 03/19/2014 10:32  ADDENDUM: Addendum is being created to expand the  differential diagnosis of right neck mass.  Although favored to represent inflammation, differential diagnosis should also include neoplasm, potentially nodal metastasis. This would be unusual pattern of spread from head and neck carcinoma, however, there is asymmetry at the tongue base and in the region of the gloss-tonsillar sulcus, which may be evaluated by laryngoscopy.  Lesion at the right base of neck could be further evaluated with biopsy, if warranted.  These results will be called to the ordering clinician or representative by the Radiologist Assistant, and communication documented in the PACS or zVision Dashboard.  Signed,  Dulcy Fanny. Earleen Newport, DO  [facility]  [facility]   Electronically Signed  By: Corrie Mckusick D.O.   On: 03/19/2014 10:32   03/19/2014   CLINICAL DATA:  34 year old male with a history of right neck mass. No additional medical history provided.  EXAM: CT NECK WITH CONTRAST  TECHNIQUE: Multidetector CT imaging of the neck was performed using the standard protocol following the bolus administration of intravenous contrast.  CONTRAST:  131m OMNIPAQUE IOHEXOL 300 MG/ML  SOLN  COMPARISON:  None.  FINDINGS: Skull base:  Unremarkable appearance of the soft tissues of the skullbase.  Visualized orbits unremarkable.  Unremarkable appearance of the visualized supra zygomatic masticator space and infra zygomatic masticator space.  Small bilateral lymph nodes of the parotid glands which are otherwise unremarkable.  Parapharyngeal fat maintained. Fullness of the tonsillar tissues with no focal mass identified.  The left and right carotid space are maintained, with maintained fat planes and architecture. No mass identified.  Unremarkable appearance of the retropharyngeal space and the deep cervical space of the neck.  Unremarkable appearance of the visualized intracranial structures.  Relatively unremarkable appearance of the pharyngeal mucosa the  supraglottic and infraglottic regions. Airway patency maintained.  There are lymph nodes present along the bilateral jugular/cervical nodal stations, none of which measure enlarged by CT size criteria. The largest measures 11 mm on the right at the angle of the mandible (image 38 of series 201). There are numerous bilateral lymph nodes present. Lymph nodes extend to the supraclavicular region and into the mediastinum. Superior right peritracheal node measures 11 mm on image 99 of series 2 will 1.  Underlying the fiducial at the base of the right neck there is a low-density fluid collection extending from the skin surface into the deep soft tissues, crossing the tissue planes and terminating deep to the right external jugular vein. There appears to be rim enhancement, with the greatest diameter measuring 3.4 cm and transverse diameter measuring approximately 10 mm. The fluid is posterior to the right sternocleidomastoid, and inseparable from the muscle belly.  Evidence of endodontal disease with dental caries eroding the posterior left and right maxillary molars.  Vascular:  Unremarkable appearance of the aortic arch and branch vessels. Bilateral carotid system are unremarkable without significant plaque. Bilateral vertebral arteries are patent.  Unremarkable appearance of the bilateral jugular veins which are patent with no thrombus.  Unremarkable appearance of the thyroid tissue.  No acute bony abnormality identified.  Mild degenerative disc disease of the cervical spine.  IMPRESSION: In the region of interest at the right neck base there is a low-density rim enhancing fluid collection extending from the skin surface deep to the sternocleidomastoid and right external jugular vein measuring as long as 3.4 cm. Most likely this represents inflammation/small abscess, though also included in the differential would be a vascular malformation, which could potentially be secondarily inflamed/infected.  Multiple lymph nodes  of the head and neck, none of which are enlarged by CT size criteria and are likely reactive. Correlation the patient's symptoms and presentation may be useful.  Dental disease, with dental caries eroding the left and right posterior axillary molar teeth.  Signed,  JDulcy Fanny WEarleen Newport DO  [facility]  [facility]  Electronically Signed: By: JCorrie MckusickD.O. On: 03/18/2014 18:09    Assessment/Plan:  89yom with hiv disease who has right neck mass concern for bacterial etiology vs. Other. Underwent fna today  Will recommend aerobic, fungal, and afb culture on fna specimen Will start him on doxycycline 1067mbid  for 10 days, for empiric  treatment of furuncle Keep on same hiv regimen of triumeq and we will decide if need tochange as an outpatient basis  Will have him come back to id clinic in 2 wk for evaluation  [facility] [phone]

## 2014-03-19 NOTE — Discharge Summary (Signed)
Physician Discharge Summary  Curtis Burton ZOX:[id number] DOB: Dec 02, 1980 DOA: 03/18/2014  PCP: Default, Provider, MD  Admit date: 03/18/2014 Discharge date: 03/19/2014  Time spent: 35 minutes  Recommendations for Outpatient Follow-up:  1. Follow up with ID in 2 weeks.   Discharge Diagnoses:  Active Problems:   AIDS (acquired immunodeficiency syndrome), CD4 <=200   Neck nodule   Lesion of neck   Abscess of skin of neck   Discharge Condition: Stable  Diet recommendation: regular  There were no vitals filed for this visit.  History of present illness:  34 y.o. male Hispanic male with past medical history of HIV with last CD4 count greater than 200, CMV retinitis and PCP pneumonia did notice a nodule in the neck about 2 weeks prior to admission, is not erythematous or warm to touch. He relates this started out as a hard nodule now is soft. He relates no pain no fevers.  Hospital Course:  Lesion of neck - CT of neck as below, consulted ENT. - Recomended IR to get Fine needle aspiration. - Pt is not septic appearing. - Will d/c home on doxycycline for 2 weeks follow up with ID.  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   Procedures:  CT neck 1.20.2016  Consultations:  ENT  Discharge Exam: Filed Vitals:   03/19/14 1808  BP: 114/64  Pulse: 66  Temp: 97.5 F (36.4 C)  Resp: 16    General: see progress note  Discharge Instructions   Discharge Instructions    Diet - low sodium heart healthy    Complete by:  As directed      Increase activity slowly    Complete by:  As directed           Current Discharge Medication List    START taking these medications   Details  doxycycline (VIBRAMYCIN) 50 MG capsule Take 2 capsules (100 mg total) by mouth 2 (two) times daily. Qty: 28 capsule, Refills: 0      CONTINUE these medications which have NOT CHANGED   Details  darunavir-cobicistat (PREZCOBIX) 800-150 MG per  tablet Take 1 tablet by mouth daily. Swallow whole. Do NOT crush, break or chew tablets. Take with food. 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 Qty: 30 tablet, Refills: 11   Associated Diagnoses: AIDS (acquired immunodeficiency syndrome), CD4 <=200       No Known Allergies    The results of significant diagnostics from this hospitalization (including imaging, microbiology, ancillary and laboratory) are listed below for reference.    Significant Diagnostic Studies: Ct Soft Tissue Neck W Contrast  03/19/2014   ADDENDUM REPORT: 03/19/2014 10:32  ADDENDUM: Addendum is being created to expand the differential diagnosis of right neck mass.  Although favored to represent inflammation, differential diagnosis should also include neoplasm, potentially nodal metastasis. This would be unusual pattern of spread from head and neck carcinoma, however, there is asymmetry at the tongue base and in the region of the gloss-tonsillar sulcus, which may be evaluated by laryngoscopy.  Lesion at the right base of neck could be further evaluated with biopsy, if warranted.  These results will be called to the ordering clinician or representative by the Radiologist Assistant, and communication documented in the PACS or zVision Dashboard.  Signed,  Yvone Neu. Loreta Ave, DO  [facility]  [facility]  Electronically Signed   By: Gilmer Mor D.O.   On: 03/19/2014 10:32   03/19/2014   CLINICAL DATA:  34 year old male with a history of right neck mass. No additional medical history provided.  EXAM: CT NECK WITH CONTRAST  TECHNIQUE: Multidetector CT imaging of the neck was performed using the standard protocol following the bolus administration of intravenous contrast.  CONTRAST:  OMNIPAQUE IOHEXOL 300 MG/ML  SOLN  COMPARISON:   None.  FINDINGS: Skull base:  Unremarkable appearance of the soft tissues of the skullbase.  Visualized orbits unremarkable.  Unremarkable appearance of the visualized supra zygomatic masticator space and infra zygomatic masticator space.  Small bilateral lymph nodes of the parotid glands which are otherwise unremarkable.  Parapharyngeal fat maintained. Fullness of the tonsillar tissues with no focal mass identified.  The left and right carotid space are maintained, with maintained fat planes and architecture. No mass identified.  Unremarkable appearance of the retropharyngeal space and the deep cervical space of the neck.  Unremarkable appearance of the visualized intracranial structures.  Relatively unremarkable appearance of the pharyngeal mucosa the supraglottic and infraglottic regions. Airway patency maintained.  There are lymph nodes present along the bilateral jugular/cervical nodal stations, none of which measure enlarged by CT size criteria. The largest measures 11 mm on the right at the angle of the mandible (image 38 of series 201). There are numerous bilateral lymph nodes present. Lymph nodes extend to the supraclavicular region and into the mediastinum. Superior right peritracheal node measures 11 mm on image 99 of series 2 will 1.  Underlying the fiducial at the base of the right neck there is a low-density fluid collection extending from the skin surface into the deep soft tissues, crossing the tissue planes and terminating deep to the right external jugular vein. There appears to be rim enhancement, with the greatest diameter measuring 3.4 cm and transverse diameter measuring approximately 10 mm. The fluid is posterior to the right sternocleidomastoid, and inseparable from the muscle belly.  Evidence of endodontal disease with dental caries eroding the posterior left and right maxillary molars.  Vascular:  Unremarkable appearance of the aortic arch and branch vessels. Bilateral carotid system are  unremarkable without significant plaque. Bilateral vertebral arteries are patent.  Unremarkable appearance of the bilateral jugular veins which are patent with no thrombus.  Unremarkable appearance of the thyroid tissue.  No acute bony abnormality identified.  Mild degenerative disc disease of the cervical spine.  IMPRESSION: In the region of interest at the right neck base there is a low-density rim enhancing fluid collection extending from the skin surface deep to the sternocleidomastoid and right external jugular vein measuring as long as 3.4 cm. Most likely this represents inflammation/small abscess, though also included in the differential would be a vascular malformation, which could potentially be secondarily inflamed/infected.  Multiple lymph nodes of the head and neck, none of which are enlarged by CT size criteria and are likely reactive. Correlation the patient's symptoms and presentation may be useful.  Dental disease, with dental caries eroding the left and right posterior axillary molar teeth.  Signed,  Yvone Neu. Loreta Ave, DO  [facility]  [facility]  Electronically Signed: By: Gilmer Mor D.O. On: 03/18/2014 18:09   US Aspiration  03/19/2014   CLINICAL DATA:  Right neck mass  EXAM: ULTRASOUND-GUIDED BIOPSY OF A RIGHT NECK MASS.  CORE.  MEDICATIONS AND MEDICAL HISTORY: Versed One mg, Fentanyl 50 mcg.  Additional Medications: None.  ANESTHESIA/SEDATION: Moderate sedation  time: 5 minutes  PROCEDURE: The procedure, risks, benefits, and alternatives were explained to the patient. Questions regarding the procedure were encouraged and answered. The patient understands and consents to the procedure.  The right neck was prepped with Betadine in a sterile fashion, and a sterile drape was applied covering the operative field. A sterile gown and sterile gloves were used for the procedure.  Sonographic imaging confirms that the abnormality is a soft tissue and  vascular mass rather than a fluid collection. Under sonographic guidance, 2 18 gauge core biopsies of the right neck mass were obtained and placed in formalin. A third 18 gauge core biopsy was obtained and placed in saline for tissue culture. Final imaging was performed.  Patient tolerated the procedure well without complication. Vital sign monitoring by nursing staff during the procedure will continue as patient is in the special procedures unit for post procedure observation.  FINDINGS: The images document guide needle placement within the right neck mass. Post biopsy images demonstrate no hemorrhage.  COMPLICATIONS: None.  IMPRESSION: Successful ultrasound-guided core biopsy of a right neck mass for surgical pathology and tissue culture.   Electronically Signed   By: Maryclare BeanArt  Hoss M.D.   On: 03/19/2014 17:09    Microbiology: No results found for this or any previous visit (from the past 240 hour(s)).   Labs: Basic Metabolic Panel:  Recent Labs Lab 03/18/14 1005 03/19/14 0534  NA 137 137  K 4.1 3.9  CL 102 104  CO2 26 27  GLUCOSE 113* 111*  BUN 7 10  CREATININE 0.71 0.71  CALCIUM 9.1 8.6   Liver Function Tests:  Recent Labs Lab 03/18/14 1005  AST 40*  ALT 41  ALKPHOS 98  BILITOT 0.6  PROT 7.2  ALBUMIN 3.9   No results for input(s): LIPASE, AMYLASE in the last 168 hours. No results for input(s): AMMONIA in the last 168 hours. CBC:  Recent Labs Lab 03/18/14 1005 03/19/14 0534  WBC 7.0 5.3  NEUTROABS 4.1  --   HGB 14.6 14.2  HCT 41.3 41.4  MCV 91.8 93.7  PLT 280 252   Cardiac Enzymes: No results for input(s): CKTOTAL, CKMB, CKMBINDEX, TROPONINI in the last 168 hours. BNP: BNP (last 3 results) No results for input(s): PROBNP in the last 8760 hours. CBG: No results for input(s): GLUCAP in the last 168 hours.     Signed:  Marinda ElkFELIZ ORTIZ, Tresha Muzio  [facility] 03/19/2014, 6:46 PM

## 2014-03-19 NOTE — Procedures (Signed)
R neck mass Bx 18 g core times 2 for surg path 18 g core times one for tissue culture No comp

## 2014-03-19 NOTE — Consult Note (Signed)
Chief Complaint: Rt neck mass  Referring Physician(s): TRH  History of Present Illness: Curtis Burton is a 34 y.o. male   +HIV/AIDS Noticed small neck bump approx 2 - 3 weeks ago Enlarging Rt neck mass Request for biopsy per [facility] and ENT MD Dr Bonnielee Haff has reviewed imaging and has approved procedure I have seen and examined pt   Past Medical History  Diagnosis Date  . UTI (urinary tract infection)   . Retinitis     PERIFERAL FOCAL  . Anemia   . HIV infection     Past Surgical History  Procedure Laterality Date  . No past surgeries      Allergies: Review of patient's allergies indicates no known allergies.  Medications: Prior to Admission medications   Medication Sig Start Date End Date Taking? Authorizing Provider  darunavir-cobicistat (PREZCOBIX) 800-150 MG per tablet Take 1 tablet by mouth daily. Swallow whole. Do NOT crush, break or chew tablets. Take with food. 03/19/14   Ginnie Smart, MD  emtricitabine-tenofovir (TRUVADA) 200-300 MG per tablet Take 1 tablet by mouth daily. 03/19/14   Ginnie Smart, MD    History reviewed. No pertinent family history.  History   Social History  . Marital Status: Single    Spouse Name: N/A    Number of Children: N/A  . Years of Education: N/A   Social History Main Topics  . Smoking status: Current Some Day Smoker -- 0.25 packs/day for 15 years    Types: Cigarettes  . Smokeless tobacco: Never Used     Comment: slowing down on ciggs  . Alcohol Use: 1.2 oz/week    2 Shots of liquor per week     Comment: weekends  . Drug Use: No  . Sexual Activity:    Partners: Female     Comment: pt declined condoms   Other Topics Concern  . None   Social History Narrative    Review of Systems: A 12 point ROS discussed and pertinent positives are indicated in the HPI above.  All other systems are negative.  Review of Systems  Constitutional: Negative for fever and activity change.  HENT: Negative for facial  swelling, hearing loss and mouth sores.   Respiratory: Negative for shortness of breath.   Cardiovascular: Negative for chest pain.  Gastrointestinal: Negative for abdominal pain.  Genitourinary: Negative for difficulty urinating.  Musculoskeletal: Negative for back pain.  Psychiatric/Behavioral: Negative for behavioral problems and confusion.    Vital Signs: BP 112/61 mmHg  Pulse 62  Temp(Src) 97.5 F (36.4 C) (Oral)  Resp 20  SpO2 99%  Physical Exam  Constitutional: He appears well-nourished.  Speaks Spanish---but does understand English and speaks it pretty well  Cardiovascular: Normal rate and regular rhythm.   Pulmonary/Chest: Effort normal and breath sounds normal.  Abdominal: Soft. Bowel sounds are normal.  Musculoskeletal: Normal range of motion.  Neurological: He is alert.  Skin: Skin is warm.  Rt neck mass is superficial; NT approx 2 cm base- protrudes from skin approx 2-3 cm  Psychiatric: He has a normal mood and affect. His behavior is normal. Judgment and thought content normal.  Speaks English pretty well; understands easily  Nursing note and vitals reviewed.   Imaging: Ct Soft Tissue Neck W Contrast  03/19/2014   ADDENDUM REPORT: 03/19/2014 10:32  ADDENDUM: Addendum is being created to expand the differential diagnosis of right neck mass.  Although favored to represent inflammation, differential diagnosis should also include neoplasm, potentially nodal metastasis. This would be  unusual pattern of spread from head and neck carcinoma, however, there is asymmetry at the tongue base and in the region of the gloss-tonsillar sulcus, which may be evaluated by laryngoscopy.  Lesion at the right base of neck could be further evaluated with biopsy, if warranted.  These results will be called to the ordering clinician or representative by the Radiologist Assistant, and communication documented in the PACS or zVision Dashboard.  Signed,  Yvone NeuJaime S. Loreta AveWagner, DO  [facility]  [facility]   Electronically Signed   By: Gilmer MorJaime  Wagner D.O.   On: 03/19/2014 10:32   03/19/2014   CLINICAL DATA:  [age] year old male with a history of right neck mass. No additional medical history provided.  EXAM: CT NECK WITH CONTRAST  TECHNIQUE: Multidetector CT imaging of the neck was performed using the standard protocol following the bolus administration of intravenous contrast.  CONTRAST:  100mL OMNIPAQUE IOHEXOL 300 MG/ML  SOLN  COMPARISON:  None.  FINDINGS: Skull base:  Unremarkable appearance of the soft tissues of the skullbase.  Visualized orbits unremarkable.  Unremarkable appearance of the visualized supra zygomatic masticator space and infra zygomatic masticator space.  Small bilateral lymph nodes of the parotid glands which are otherwise unremarkable.  Parapharyngeal fat maintained. Fullness of the tonsillar tissues with no focal mass identified.  The left and right carotid space are maintained, with maintained fat planes and architecture. No mass identified.  Unremarkable appearance of the retropharyngeal space and the deep cervical space of the neck.  Unremarkable appearance of the visualized intracranial structures.  Relatively unremarkable appearance of the pharyngeal mucosa the supraglottic and infraglottic regions. Airway patency maintained.  There are lymph nodes present along the bilateral jugular/cervical nodal stations, none of which measure enlarged by CT size criteria. The largest measures 11 mm on the right at the angle of the mandible (image 38 of series 201). There are numerous bilateral lymph nodes present. Lymph nodes extend to the supraclavicular region and into the mediastinum. Superior right peritracheal node measures 11 mm on image 99 of series 2 will 1.  Underlying the fiducial at the base of the right neck there is a low-density fluid collection extending from the skin surface into the deep soft tissues, crossing the tissue planes  and terminating deep to the right external jugular vein. There appears to be rim enhancement, with the greatest diameter measuring 3.4 cm and transverse diameter measuring approximately 10 mm. The fluid is posterior to the right sternocleidomastoid, and inseparable from the muscle belly.  Evidence of endodontal disease with dental caries eroding the posterior left and right maxillary molars.  Vascular:  Unremarkable appearance of the aortic arch and branch vessels. Bilateral carotid system are unremarkable without significant plaque. Bilateral vertebral arteries are patent.  Unremarkable appearance of the bilateral jugular veins which are patent with no thrombus.  Unremarkable appearance of the thyroid tissue.  No acute bony abnormality identified.  Mild degenerative disc disease of the cervical spine.  IMPRESSION: In the region of interest at the right neck base there is a low-density rim enhancing fluid collection extending from the skin surface deep to the sternocleidomastoid and right external jugular vein measuring as long as 3.4 cm. Most likely this represents inflammation/small abscess, though also included in the differential would be a vascular malformation, which could potentially be secondarily inflamed/infected.  Multiple lymph nodes of the head and neck, none of which are enlarged by CT size criteria and are likely reactive. Correlation the patient's symptoms and  presentation may be useful.  Dental disease, with dental caries eroding the left and right posterior axillary molar teeth.  Signed,  Yvone Neu. Loreta Ave, DO  [facility]  [facility]  Electronically Signed: By: Gilmer Mor D.O. On: 03/18/2014 18:09    Labs:  CBC:  Recent Labs  01/26/14 1008 03/04/14 1019 03/18/14 1005 03/19/14 0534  WBC 3.7* 6.0 7.0 5.3  HGB 14.2 14.4 14.6 14.2  HCT 40.7 40.6 41.3 41.4  PLT 235 287 280 252    COAGS:  Recent Labs  03/19/14 0534  INR 0.97     BMP:  Recent Labs  01/26/14 1008 03/04/14 1019 03/18/14 1005 03/19/14 0534  NA 138 136 137 137  K 4.1 4.0 4.1 3.9  CL 104 102 102 104  CO2 GLUCOSE 132* 158* 113* 111*  BUN CALCIUM 8.9 9.0 9.1 8.6  CREATININE 0.61 0.56 0.71 0.71  GFRNONAA >89 >89 >90 >90  GFRAA >89 >89 >90 >90    LIVER FUNCTION TESTS:  Recent Labs  09/24/13 1708 01/26/14 1008 03/04/14 1019 03/18/14 1005  BILITOT 0.4 0.4 0.4 0.6  AST 21 44* 35 40*  ALT 19 55* 42 41  ALKPHOS 88 96 100 98  PROT 7.2 6.7 7.0 7.2  ALBUMIN 4.5 4.0 3.9 3.9    TUMOR MARKERS: No results for input(s): AFPTM, CEA, CA199, CHROMGRNA in the last 8760 hours.  Assessment and Plan:  Rt neck mass- enlarging x 2-3 weeks +HIV/AIDS Scheduled now for bx of same Pt and family aware of procedure benefits and risks and agreeable to proceed consent signed andin chart  Thank you for this interesting consult.  I greatly enjoyed meeting Curtis Burton and look forward to participating in their care.    I spent a total of 20 minutes face to face in clinical consultation, greater than 50% of which was counseling/coordinating care for Rt neck mass bx  Signed: Alexi Geibel A 03/19/2014, 12:11 PM

## 2014-03-20 LAB — HIV-1 RNA ULTRAQUANT REFLEX TO GENTYP+
HIV 1 RNA Quant: 17997 copies/mL — ABNORMAL HIGH (ref <20)
HIV-1 RNA QUANT, LOG: 4.26 {Log} — AB (ref <1.30)

## 2014-03-23 LAB — TISSUE CULTURE
CULTURE: NO GROWTH
Gram Stain: NONE SEEN

## 2014-03-25 LAB — HIV-1 INTEGRASE GENOTYPE

## 2014-03-31 LAB — HIV-1 GENOTYPR PLUS

## 2014-04-17 LAB — FUNGUS CULTURE W SMEAR: FUNGAL SMEAR: NONE SEEN

## 2014-04-30 ENCOUNTER — Telehealth: Payer: Self-pay | Admitting: *Deleted

## 2014-04-30 NOTE — Telephone Encounter (Signed)
He does not need the Bactrim anymore

## 2014-04-30 NOTE — Telephone Encounter (Signed)
Thanks

## 2014-04-30 NOTE — Telephone Encounter (Signed)
[facility] called for clarification of ARV, prophylaxis.  RN confirmed ARV.  Please advise if you would still like patient to be on Bactrim DS 1/day. Pharmacy states he has not been on this since August 2015 and they would need a new prescription. Andree CossHowell, Michelle M, RN

## 2014-05-01 NOTE — Telephone Encounter (Signed)
No problem.

## 2014-05-03 LAB — AFB CULTURE WITH SMEAR (NOT AT ARMC): Acid Fast Smear: NONE SEEN

## 2014-07-13 ENCOUNTER — Encounter: Payer: Self-pay | Admitting: Infectious Disease

## 2014-07-13 ENCOUNTER — Ambulatory Visit (INDEPENDENT_AMBULATORY_CARE_PROVIDER_SITE_OTHER): Payer: Self-pay | Admitting: Infectious Disease

## 2014-07-13 ENCOUNTER — Other Ambulatory Visit: Payer: Self-pay | Admitting: Infectious Disease

## 2014-07-13 VITALS — BP 119/76 | HR 108 | Temp 97.9°F | Ht 64.0 in | Wt 151.0 lb

## 2014-07-13 DIAGNOSIS — B59 Pneumocystosis: Secondary | ICD-10-CM

## 2014-07-13 DIAGNOSIS — H3090 Unspecified chorioretinal inflammation, unspecified eye: Secondary | ICD-10-CM

## 2014-07-13 DIAGNOSIS — R221 Localized swelling, mass and lump, neck: Secondary | ICD-10-CM

## 2014-07-13 DIAGNOSIS — B20 Human immunodeficiency virus [HIV] disease: Secondary | ICD-10-CM

## 2014-07-13 DIAGNOSIS — B259 Cytomegaloviral disease, unspecified: Secondary | ICD-10-CM

## 2014-07-13 LAB — COMPLETE METABOLIC PANEL WITH GFR
ALBUMIN: 4.2 g/dL (ref 3.5–5.2)
ALT: 24 U/L (ref 0–53)
AST: 21 U/L (ref 0–37)
Alkaline Phosphatase: 108 U/L (ref 39–117)
BUN: 11 mg/dL (ref 6–23)
CALCIUM: 9.3 mg/dL (ref 8.4–10.5)
CHLORIDE: 99 meq/L (ref 96–112)
CO2: 26 meq/L (ref 19–32)
Creat: 0.71 mg/dL (ref 0.50–1.35)
GFR, Est Non African American: >89 mL/min
Glucose, Bld: 155 mg/dL — ABNORMAL HIGH (ref 70–99)
Potassium: 4.4 mEq/L (ref 3.5–5.3)
SODIUM: 137 meq/L (ref 135–145)
Total Bilirubin: 0.7 mg/dL (ref 0.2–1.2)
Total Protein: 7.9 g/dL (ref 6.0–8.3)

## 2014-07-13 NOTE — Progress Notes (Signed)
Subjective:    Patient ID: Curtis KlinefelterMiguel Rodriguez Burton, male    DOB: Jan 20, 1981, 34 y.o.   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   He was being followed by   Dr. Allyne GeeSanders for CMV. His HIV had been perfectly suppressed on Atripla but CD4 was  Been slow to climb. He has seen Dr. Allyne GeeSanders.   [facility] and he is maintained healthy virological suppression and now his CD4 count is above 200 but then has had virological failure when checked in November with 184 V, viral load did drop further since then but I have concern he may have developed INSTI resistance in the interim. CD4 had dropped below 200 but now back above 200.  I saw him in January and admitted him for workup for cystic lesion on his neck. He was switched to PREZCOBIX and Truvada and claims to have not missed any doses since then.  Lab Results  Component Value Date   HIV1RNAQUANT 17997* 03/18/2014   Lab Results  Component Value Date   CD4TABS 260* 03/18/2014   CD4TABS 200* 03/04/2014   CD4TABS 130* 01/26/2014     Review of Systems  Constitutional: Negative for chills, diaphoresis, activity change, appetite change, fatigue and unexpected weight change.  HENT: Negative for rhinorrhea, sinus pressure, sneezing and trouble swallowing.   Eyes: Negative for photophobia.  Respiratory: Negative for cough, chest tightness, shortness of breath and stridor.   Cardiovascular: Negative for palpitations and leg swelling.  Gastrointestinal: Negative for constipation, blood in stool, abdominal distention and anal bleeding.  Genitourinary: Negative for dysuria, hematuria, flank pain and difficulty urinating.  Musculoskeletal: Negative for myalgias, back pain, joint swelling, arthralgias and gait problem.  Skin: Negative for color change, pallor and wound.  Neurological: Negative for dizziness, tremors, weakness and light-headedness.    Hematological: Negative for adenopathy. Does not bruise/bleed easily.  Psychiatric/Behavioral: Negative for behavioral problems, confusion, sleep disturbance, dysphoric mood, decreased concentration and agitation. The patient is not nervous/anxious.        Objective:   Physical Exam  Constitutional: He is oriented to person, place, and time. He appears well-developed and well-nourished. No distress.  HENT:  Head: Normocephalic and atraumatic.  Mouth/Throat: Oropharynx is clear and moist. No oropharyngeal exudate.  Eyes: Conjunctivae and EOM are normal. No scleral icterus.  Neck: Normal range of motion. Neck supple. No JVD present.  Cardiovascular: Normal rate and regular rhythm.   Pulmonary/Chest: Effort normal. No respiratory distress. He has no wheezes.  Abdominal: He exhibits no distension.  Musculoskeletal: He exhibits no edema or tenderness.  Lymphadenopathy:    He has no cervical adenopathy.  Neurological: He is alert and oriented to person, place, and time. He exhibits normal muscle tone. Coordination normal.  Skin: Skin is warm and dry. He is not diaphoretic. No erythema. No pallor.  Psychiatric: He has a normal mood and affect. His behavior is normal. Judgment and thought content normal.    Pink lesion on neck that is raised and tender, ? Overlying major vein January 2016         Assessment & Plan:  HIV:  Recheck VL conventional genotype, continue PREZCOBIX and Truvada, bring back in 2 months for labs again and renew ADAP  I spent greater than 25 minutes with the patient including greater than 50% of time in face to face counsel of the patient re importance of taking his ARVs and preventing  further resistance and in coordination of their care.   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 Allyne GeeSanders.   PCP: continue daily Bactrim for PCP prophylaxis until 3 months consecutive CD4  count above 200 consistently

## 2014-07-13 NOTE — Addendum Note (Signed)
Addended by: Andree CossHOWELL, Yaasir Menken M on: 07/13/2014 04:49 PM   Modules accepted: Orders

## 2014-07-14 LAB — MICROALBUMIN / CREATININE URINE RATIO
Creatinine, Urine: 271.4 mg/dL
MICROALB UR: 2.2 mg/dL — AB (ref <2.0)
Microalb Creat Ratio: 8.1 mg/g (ref 0.0–30.0)

## 2014-07-14 LAB — CBC WITH DIFFERENTIAL/PLATELET
BASOS ABS: 0 10*3/uL (ref 0.0–0.1)
BASOS PCT: 0 % (ref 0–1)
Eosinophils Absolute: 0.4 10*3/uL (ref 0.0–0.7)
Eosinophils Relative: 6 % — ABNORMAL HIGH (ref 0–5)
HEMATOCRIT: 45 % (ref 39.0–52.0)
Hemoglobin: 15.5 g/dL (ref 13.0–17.0)
Lymphocytes Relative: 22 % (ref 12–46)
Lymphs Abs: 1.4 10*3/uL (ref 0.7–4.0)
MCH: 32.6 pg (ref 26.0–34.0)
MCHC: 34.4 g/dL (ref 30.0–36.0)
MCV: 94.5 fL (ref 78.0–100.0)
MPV: 9.9 fL (ref 8.6–12.4)
Monocytes Absolute: 0.5 10*3/uL (ref 0.1–1.0)
Monocytes Relative: 7 % (ref 3–12)
NEUTROS ABS: 4.2 10*3/uL (ref 1.7–7.7)
NEUTROS PCT: 65 % (ref 43–77)
PLATELETS: 320 10*3/uL (ref 150–400)
RBC: 4.76 MIL/uL (ref 4.22–5.81)
RDW: 13.6 % (ref 11.5–15.5)
WBC: 6.5 10*3/uL (ref 4.0–10.5)

## 2014-07-14 LAB — URINE CYTOLOGY ANCILLARY ONLY
CHLAMYDIA, DNA PROBE: NEGATIVE
NEISSERIA GONORRHEA: NEGATIVE

## 2014-07-14 LAB — RPR

## 2014-07-14 LAB — HIV-1 RNA ULTRAQUANT REFLEX TO GENTYP+: HIV 1 RNA Quant: <20 copies/mL (ref <20)

## 2014-07-15 LAB — T-HELPER CELL (CD4) - (RCID CLINIC ONLY)
CD4 T CELL HELPER: 14 % — AB (ref 33–55)
CD4 T Cell Abs: 200 /uL — ABNORMAL LOW (ref 400–2700)

## 2014-09-14 ENCOUNTER — Other Ambulatory Visit (INDEPENDENT_AMBULATORY_CARE_PROVIDER_SITE_OTHER): Payer: Self-pay

## 2014-09-14 DIAGNOSIS — B20 Human immunodeficiency virus [HIV] disease: Secondary | ICD-10-CM

## 2014-09-14 DIAGNOSIS — Z113 Encounter for screening for infections with a predominantly sexual mode of transmission: Secondary | ICD-10-CM

## 2014-09-14 LAB — CBC WITH DIFFERENTIAL/PLATELET
BASOS ABS: 0 10*3/uL (ref 0.0–0.1)
Basophils Relative: 0 % (ref 0–1)
EOS ABS: 0.3 10*3/uL (ref 0.0–0.7)
Eosinophils Relative: 6 % — ABNORMAL HIGH (ref 0–5)
HCT: 40.4 % (ref 39.0–52.0)
HEMOGLOBIN: 14.3 g/dL (ref 13.0–17.0)
Lymphocytes Relative: 28 % (ref 12–46)
Lymphs Abs: 1.5 10*3/uL (ref 0.7–4.0)
MCH: 32.9 pg (ref 26.0–34.0)
MCHC: 35.4 g/dL (ref 30.0–36.0)
MCV: 93.1 fL (ref 78.0–100.0)
MONOS PCT: 9 % (ref 3–12)
MPV: 10 fL (ref 8.6–12.4)
Monocytes Absolute: 0.5 10*3/uL (ref 0.1–1.0)
NEUTROS ABS: 3 10*3/uL (ref 1.7–7.7)
NEUTROS PCT: 57 % (ref 43–77)
Platelets: 269 10*3/uL (ref 150–400)
RBC: 4.34 MIL/uL (ref 4.22–5.81)
RDW: 13.9 % (ref 11.5–15.5)
WBC: 5.3 10*3/uL (ref 4.0–10.5)

## 2014-09-15 LAB — RPR

## 2014-09-15 LAB — T-HELPER CELL (CD4) - (RCID CLINIC ONLY)
CD4 % Helper T Cell: 18 % — ABNORMAL LOW (ref 33–55)
CD4 T CELL ABS: 250 /uL — AB (ref 400–2700)

## 2014-09-15 LAB — URINE CYTOLOGY ANCILLARY ONLY
Chlamydia: NEGATIVE
NEISSERIA GONORRHEA: NEGATIVE

## 2014-09-15 LAB — HIV-1 RNA QUANT-NO REFLEX-BLD
HIV 1 RNA Quant: <20 copies/mL (ref <20)
HIV-1 RNA Quant, Log: <1.3 {Log} (ref <1.30)

## 2014-09-28 ENCOUNTER — Ambulatory Visit: Payer: Self-pay

## 2014-09-30 ENCOUNTER — Ambulatory Visit: Payer: Self-pay | Admitting: Infectious Disease

## 2014-10-15 ENCOUNTER — Encounter: Payer: Self-pay | Admitting: Infectious Disease

## 2014-10-15 ENCOUNTER — Ambulatory Visit (INDEPENDENT_AMBULATORY_CARE_PROVIDER_SITE_OTHER): Payer: Self-pay | Admitting: Infectious Disease

## 2014-10-15 VITALS — BP 126/76 | HR 71 | Temp 98.6°F | Wt 157.0 lb

## 2014-10-15 DIAGNOSIS — B259 Cytomegaloviral disease, unspecified: Secondary | ICD-10-CM

## 2014-10-15 DIAGNOSIS — B20 Human immunodeficiency virus [HIV] disease: Secondary | ICD-10-CM

## 2014-10-15 DIAGNOSIS — H3090 Unspecified chorioretinal inflammation, unspecified eye: Secondary | ICD-10-CM

## 2014-10-15 DIAGNOSIS — B59 Pneumocystosis: Secondary | ICD-10-CM

## 2014-10-15 MED ORDER — EMTRICITABINE-TENOFOVIR AF 200-25 MG PO TABS: 1.0000 | ORAL_TABLET | Freq: Every day | ORAL | Status: DC

## 2014-10-15 NOTE — Progress Notes (Signed)
Subjective:    Patient ID: Curtis Burton, male    DOB: 14-Jul-1980, 34 y.o.   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   He was being followed by   Dr. Allyne Gee for CMV. His HIV had been perfectly suppressed on Atripla but CD4 was  Been slow to climb. He has seen Dr. Allyne Gee.   We switched him to [facility] and he is maintained healthy virological suppression and now his CD4 count is above 200 but then has had virological failure when checked in November with 184 V, viral load did drop further since then but I have concern he may have developed INSTI resistance in the interim. CD4 had dropped below 200 but now back above 200.  I saw him in January and admitted him for workup for cystic lesion on his neck. He was switched to PREZCOBIX and Truvada and claims to have not missed any doses since then.  Lab Results  Component Value Date   HIV1RNAQUANT <20 09/14/2014   Lab Results  Component Value Date   CD4TABS 250* 09/14/2014   CD4TABS 200* 07/13/2014   CD4TABS 260* 03/18/2014   Today he has no complaints whatsoever and is doing well.  Review of Systems  Constitutional: Negative for chills, diaphoresis, activity change, appetite change, fatigue and unexpected weight change.  HENT: Negative for rhinorrhea, sinus pressure, sneezing and trouble swallowing.   Eyes: Negative for photophobia.  Respiratory: Negative for cough, chest tightness, shortness of breath and stridor.   Cardiovascular: Negative for palpitations and leg swelling.  Gastrointestinal: Negative for constipation, blood in stool, abdominal distention and anal bleeding.  Genitourinary: Negative for dysuria, hematuria, flank pain and difficulty urinating.  Musculoskeletal: Negative for myalgias, back pain, joint swelling, arthralgias and gait problem.  Skin: Negative for color change, pallor and wound.  Neurological: Negative  for dizziness, tremors, weakness and light-headedness.  Hematological: Negative for adenopathy. Does not bruise/bleed easily.  Psychiatric/Behavioral: Negative for behavioral problems, confusion, sleep disturbance, dysphoric mood, decreased concentration and agitation. The patient is not nervous/anxious.        Objective:   Physical Exam  Constitutional: He is oriented to person, place, and time. He appears well-developed and well-nourished. No distress.  HENT:  Head: Normocephalic and atraumatic.  Mouth/Throat: Oropharynx is clear and moist. No oropharyngeal exudate.  Eyes: Conjunctivae and EOM are normal. No scleral icterus.  Neck: Normal range of motion. Neck supple. No JVD present.  Cardiovascular: Normal rate and regular rhythm.   Pulmonary/Chest: Effort normal. No respiratory distress. He has no wheezes.  Abdominal: He exhibits no distension.  Musculoskeletal: He exhibits no edema or tenderness.  Lymphadenopathy:    He has no cervical adenopathy.  Neurological: He is alert and oriented to person, place, and time. He exhibits normal muscle tone. Coordination normal.  Skin: Skin is warm and dry. He is not diaphoretic. No erythema. No pallor.  Psychiatric: He has a normal mood and affect. His behavior is normal. Judgment and thought content normal.        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 Allyne Gee.   PCP:  discontinue daily Bactrim for PCP   I spent greater than 25 minutes with the patient including greater than 50% of time in  face to face counsel of the patient re importance of taking his ARVs and preventing further resistance and in coordination of their care.

## 2014-10-21 ENCOUNTER — Ambulatory Visit: Payer: Self-pay | Admitting: Infectious Disease

## 2014-11-16 NOTE — Progress Notes (Signed)
[facility] notified via fax. Howell, Michelle M, RN   

## 2015-03-06 ENCOUNTER — Other Ambulatory Visit: Payer: Self-pay | Admitting: Infectious Disease

## 2015-04-12 ENCOUNTER — Ambulatory Visit: Payer: Self-pay

## 2015-04-12 ENCOUNTER — Other Ambulatory Visit (INDEPENDENT_AMBULATORY_CARE_PROVIDER_SITE_OTHER): Payer: Self-pay

## 2015-04-12 DIAGNOSIS — B20 Human immunodeficiency virus [HIV] disease: Secondary | ICD-10-CM

## 2015-04-12 DIAGNOSIS — Z113 Encounter for screening for infections with a predominantly sexual mode of transmission: Secondary | ICD-10-CM

## 2015-04-12 DIAGNOSIS — Z79899 Other long term (current) drug therapy: Secondary | ICD-10-CM

## 2015-04-12 LAB — COMPLETE METABOLIC PANEL WITH GFR
ALBUMIN: 4.2 g/dL (ref 3.6–5.1)
ALT: 23 U/L (ref 9–46)
AST: 19 U/L (ref 10–40)
Alkaline Phosphatase: 95 U/L (ref 40–115)
BILIRUBIN TOTAL: 0.6 mg/dL (ref 0.2–1.2)
BUN: 12 mg/dL (ref 7–25)
CALCIUM: 9.5 mg/dL (ref 8.6–10.3)
CO2: 32 mmol/L — ABNORMAL HIGH (ref 20–31)
Chloride: 97 mmol/L — ABNORMAL LOW (ref 98–110)
Creat: 0.75 mg/dL (ref 0.60–1.35)
GFR, Est African American: >89 mL/min (ref >=60)
GFR, Est Non African American: >89 mL/min (ref >=60)
GLUCOSE: 143 mg/dL — AB (ref 65–99)
POTASSIUM: 4.4 mmol/L (ref 3.5–5.3)
SODIUM: 138 mmol/L (ref 135–146)
Total Protein: 7.5 g/dL (ref 6.1–8.1)

## 2015-04-12 LAB — CBC WITH DIFFERENTIAL/PLATELET
BASOS ABS: 0.1 10*3/uL (ref 0.0–0.1)
BASOS PCT: 1 % (ref 0–1)
Eosinophils Absolute: 0.4 10*3/uL (ref 0.0–0.7)
Eosinophils Relative: 7 % — ABNORMAL HIGH (ref 0–5)
HEMATOCRIT: 43.1 % (ref 39.0–52.0)
HEMOGLOBIN: 14.9 g/dL (ref 13.0–17.0)
Lymphocytes Relative: 24 % (ref 12–46)
Lymphs Abs: 1.5 10*3/uL (ref 0.7–4.0)
MCH: 32.5 pg (ref 26.0–34.0)
MCHC: 34.6 g/dL (ref 30.0–36.0)
MCV: 94.1 fL (ref 78.0–100.0)
MPV: 10.3 fL (ref 8.6–12.4)
Monocytes Absolute: 0.5 10*3/uL (ref 0.1–1.0)
Monocytes Relative: 8 % (ref 3–12)
NEUTROS ABS: 3.8 10*3/uL (ref 1.7–7.7)
NEUTROS PCT: 60 % (ref 43–77)
Platelets: 299 10*3/uL (ref 150–400)
RBC: 4.58 MIL/uL (ref 4.22–5.81)
RDW: 12.9 % (ref 11.5–15.5)
WBC: 6.4 10*3/uL (ref 4.0–10.5)

## 2015-04-12 LAB — LIPID PANEL
Cholesterol: 191 mg/dL (ref 125–200)
HDL: 56 mg/dL (ref >=40)
LDL CALC: 106 mg/dL (ref <130)
Total CHOL/HDL Ratio: 3.4 Ratio (ref <=5.0)
Triglycerides: 146 mg/dL (ref <150)
VLDL: 29 mg/dL (ref <30)

## 2015-04-12 NOTE — Addendum Note (Signed)
Addended by: Mariea Clonts D on: 04/12/2015 03:02 PM   Modules accepted: Orders

## 2015-04-12 NOTE — Addendum Note (Signed)
Addended by: Mariea Clonts D on: 04/12/2015 03:58 PM   Modules accepted: Orders

## 2015-04-13 LAB — RPR

## 2015-04-13 LAB — T-HELPER CELL (CD4) - (RCID CLINIC ONLY)
CD4 % Helper T Cell: 15 % — ABNORMAL LOW (ref 33–55)
CD4 T Cell Abs: 210 /uL — ABNORMAL LOW (ref 400–2700)

## 2015-04-13 LAB — HIV-1 RNA QUANT-NO REFLEX-BLD
HIV 1 RNA Quant: <20 copies/mL (ref <20)
HIV-1 RNA Quant, Log: <1.3 Log copies/mL (ref <1.30)

## 2015-04-28 ENCOUNTER — Ambulatory Visit (INDEPENDENT_AMBULATORY_CARE_PROVIDER_SITE_OTHER): Payer: Self-pay | Admitting: Infectious Disease

## 2015-04-28 ENCOUNTER — Encounter: Payer: Self-pay | Admitting: Infectious Disease

## 2015-04-28 VITALS — BP 117/74 | HR 74 | Temp 98.1°F | Wt 153.0 lb

## 2015-04-28 DIAGNOSIS — B20 Human immunodeficiency virus [HIV] disease: Secondary | ICD-10-CM

## 2015-04-28 DIAGNOSIS — H3090 Unspecified chorioretinal inflammation, unspecified eye: Secondary | ICD-10-CM

## 2015-04-28 DIAGNOSIS — B259 Cytomegaloviral disease, unspecified: Secondary | ICD-10-CM

## 2015-04-28 IMAGING — US US ASPIRATION
1 series · 12 of 12 positions shown · non-contrast
Comparison: none

CLINICAL DATA: Right neck mass

[Series 1: us aspiration · 0.06mm/px · 12 of 12 slices shown]
[im 1/12]
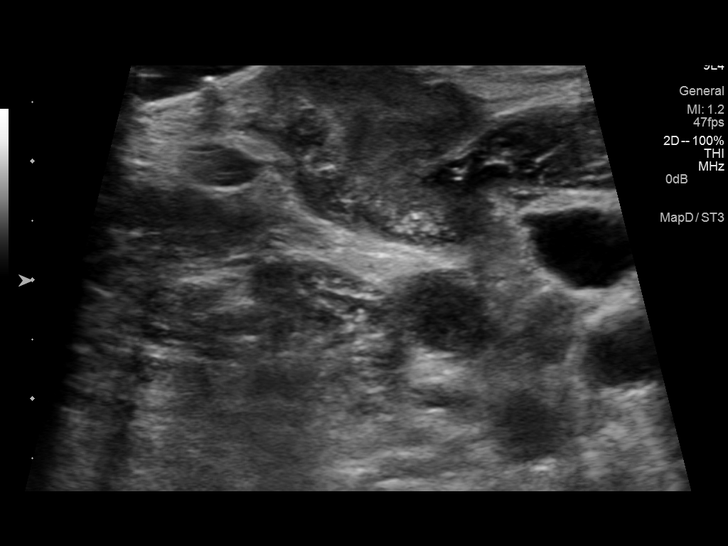
[im 2/12]
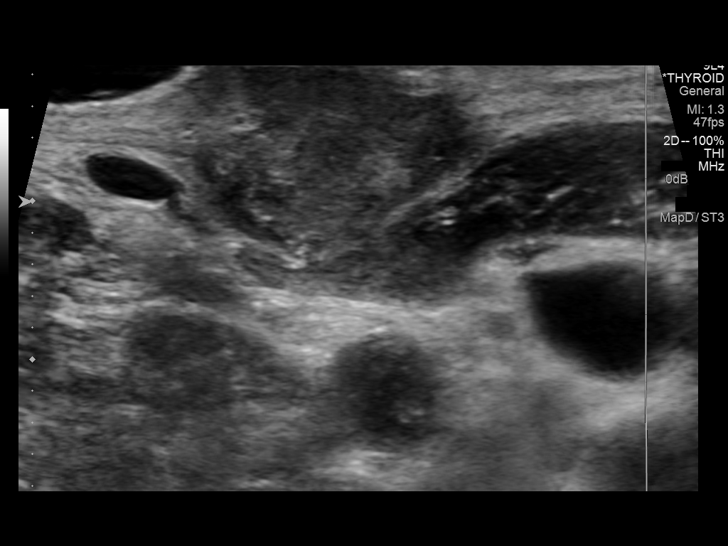
[im 3/12]
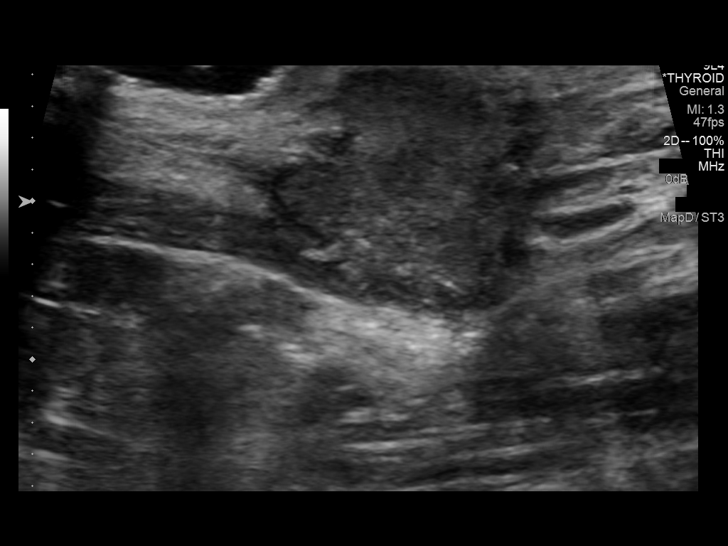
[im 4/12]
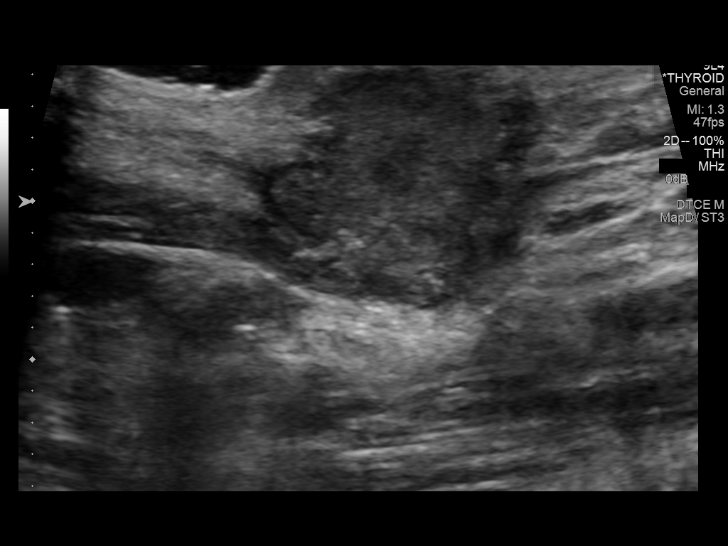
[im 5/12]
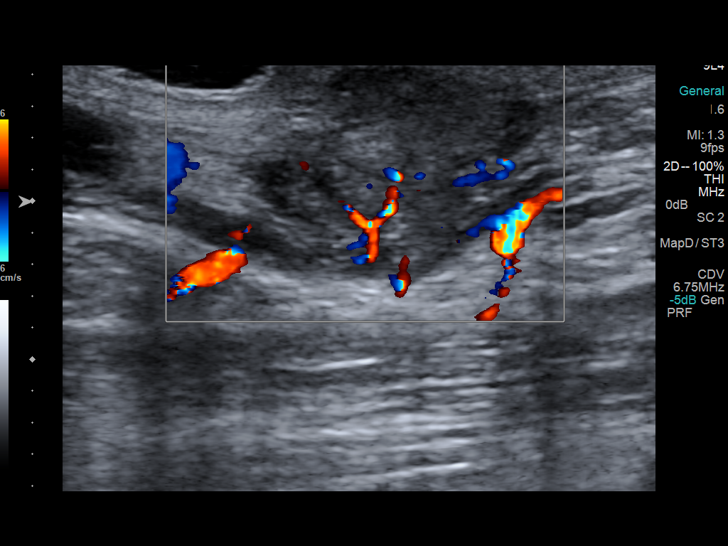
[im 6/12]
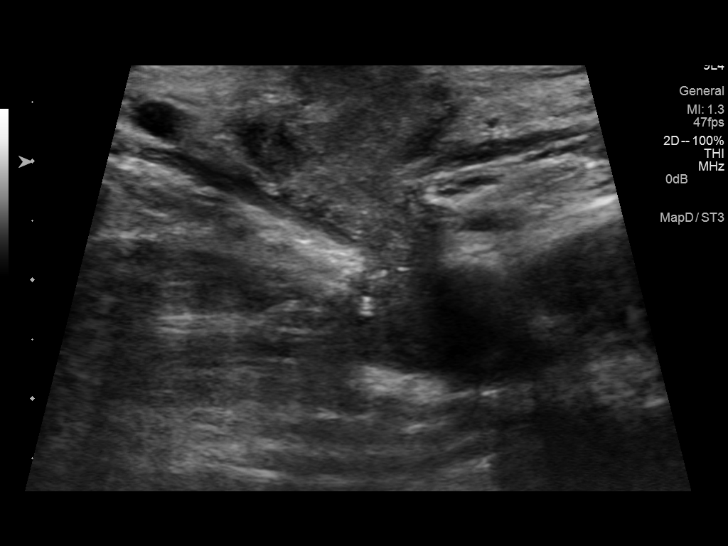
[im 7/12]
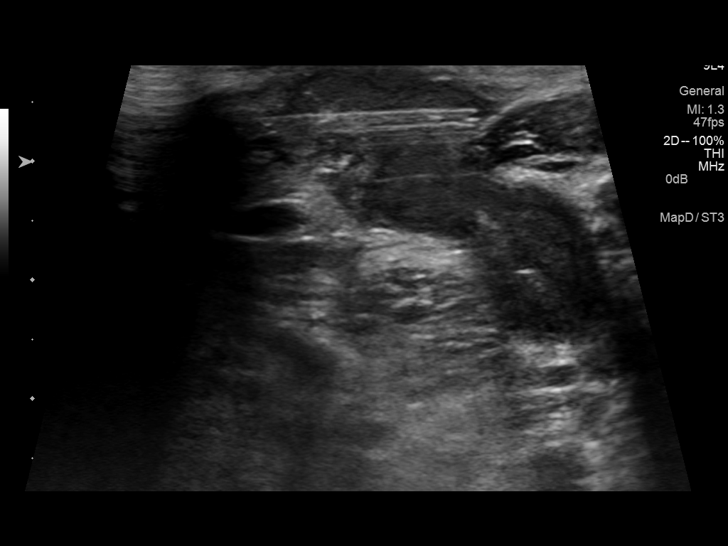
[im 8/12]
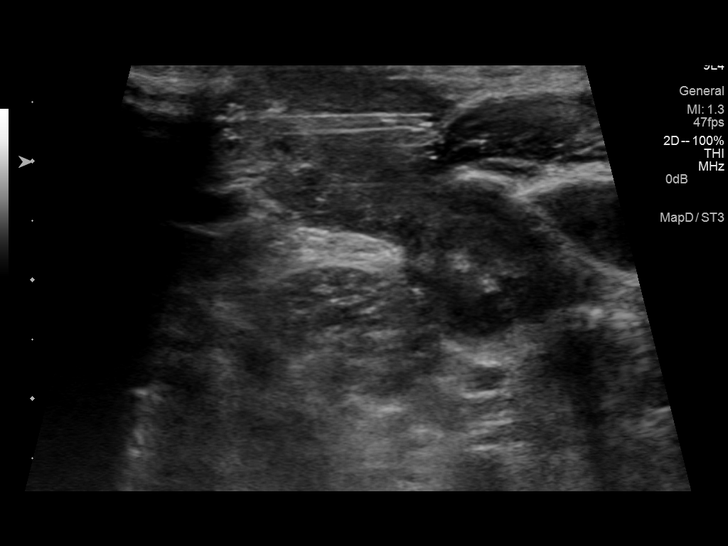
[im 9/12]
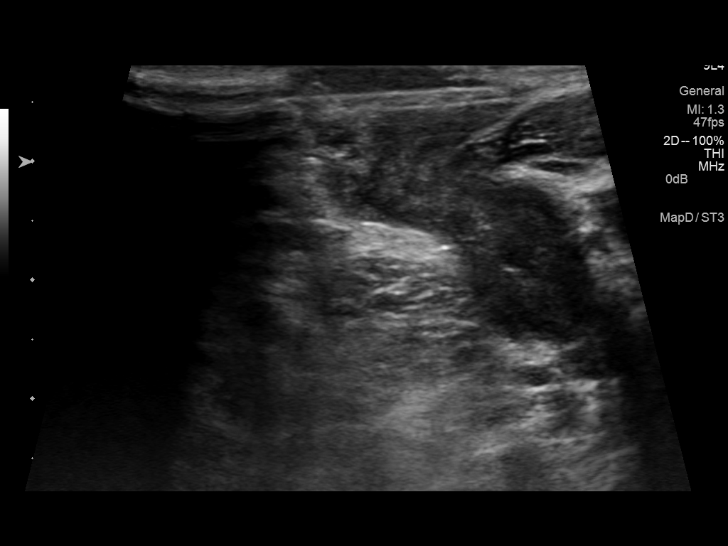
[im 10/12]
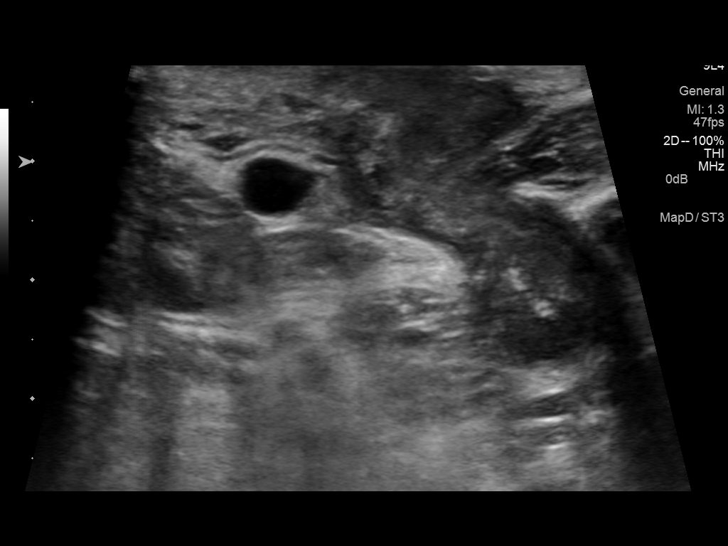
[im 11/12]
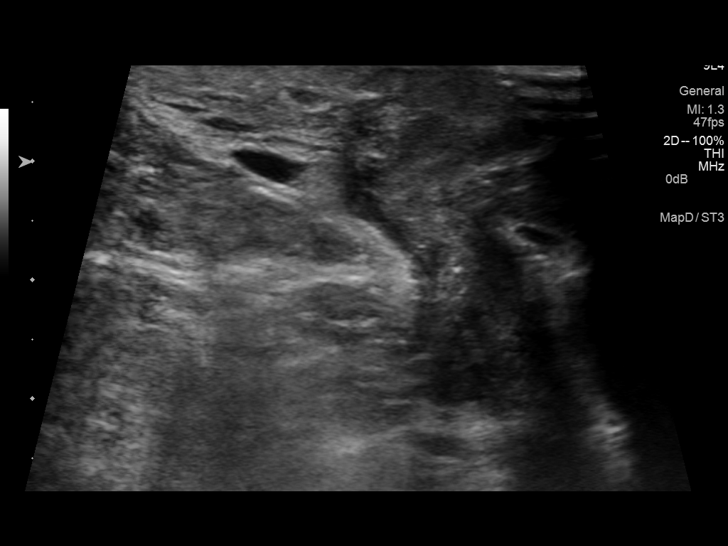
[im 12/12]
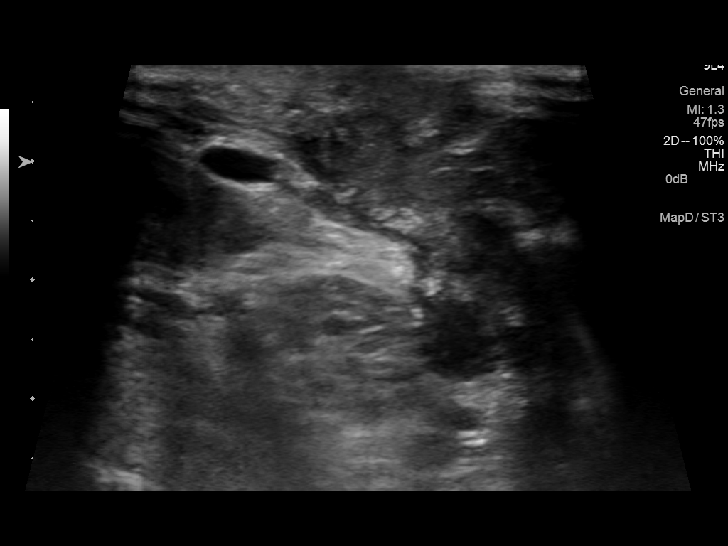

[12 of 12 positions shown; findings below may reference images not displayed]

EXAM:
ULTRASOUND-GUIDED BIOPSY OF A RIGHT NECK MASS.  CORE.

MEDICATIONS AND MEDICAL HISTORY:
Versed One mg, Fentanyl 50 mcg.

Additional Medications: None.

ANESTHESIA/SEDATION:
Moderate sedation time: 5 minutes

PROCEDURE:
The procedure, risks, benefits, and alternatives were explained to
the patient. Questions regarding the procedure were encouraged and
answered. The patient understands and consents to the procedure.

The right neck was prepped with Betadine in a sterile fashion, and a
sterile drape was applied covering the operative field. A sterile
gown and sterile gloves were used for the procedure.

Sonographic imaging confirms that the abnormality is a soft tissue
and vascular mass rather than a fluid collection. Under sonographic
guidance, 2 18 gauge core biopsies of the right neck mass were
obtained and placed in formalin. A third 18 gauge core biopsy was
obtained and placed in saline for tissue culture. Final imaging was
performed.

Patient tolerated the procedure well without complication. Vital
sign monitoring by nursing staff during the procedure will continue
as patient is in the special procedures unit for post procedure
observation.
FINDINGS: The images document guide needle placement within the right neck
mass. Post biopsy images demonstrate no hemorrhage.

COMPLICATIONS:
None.
IMPRESSION: Successful ultrasound-guided core biopsy of a right neck mass for
surgical pathology and tissue culture.

## 2015-04-28 NOTE — Progress Notes (Signed)
Subjective:    Patient ID: Curtis Burton, male    DOB: [birth year], 35 y.o.   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   He was being followed by   Dr. Allyne Gee for CMV. His HIV had been perfectly suppressed on Atripla.  We then  switched him to [facility] and he maintained healthy virological suppression and now his CD4 count is above 200 but then has had virological failure when checked in  with 184 V, viral load did drop further since then but I have concern he may have developed INSTI resistance in the interim. He has since been on Prezcobix and Descovy with healthy virological suppresion.  Lab Results  Component Value Date   HIV1RNAQUANT <20 04/12/2015   Lab Results  Component Value Date   CD4TABS 210* 04/12/2015   CD4TABS 250* 09/14/2014   CD4TABS 200* 07/13/2014   Today he has no complaints whatsoever and is doing well.   Past Medical History  Diagnosis Date  . UTI (urinary tract infection)   . Retinitis     PERIFERAL FOCAL  . Anemia   . HIV infection [facility])     Past Surgical History  Procedure Laterality Date  . No past surgeries      No family history on file.    Social History   Social History  . Marital Status: Single    Spouse Name: N/A  . Number of Children: N/A  . Years of Education: N/A   Social History Main Topics  . Smoking status: Current Some Day Smoker -- 0.25 packs/day for 15 years    Types: Cigarettes  . Smokeless tobacco: Never Used     Comment: slowing down on ciggs  . Alcohol Use: 1.2 oz/week    2 Shots of liquor per week     Comment: weekends  . Drug Use: No  . Sexual Activity:    Partners: Female     Comment: pt declined condoms   Other Topics Concern  . None   Social History Narrative    No Known Allergies   Current outpatient prescriptions:  .  darunavir-cobicistat (PREZCOBIX) 800-150 MG per tablet, Take 1 tablet by  mouth daily. Swallow whole. Do NOT crush, break or chew tablets. Take with food., Disp: 30 tablet, Rfl: 5 .  emtricitabine-tenofovir AF (DESCOVY) 200-25 MG per tablet, Take 1 tablet by mouth daily., Disp: 30 tablet, Rfl: 11 .  PREZCOBIX 800-150 MG tablet, TAKE 1 TABLET BY MOUTH DAILY, Disp: 30 tablet, Rfl: 4   Review of Systems  Constitutional: Negative for chills, diaphoresis, activity change, appetite change, fatigue and unexpected weight change.  HENT: Negative for rhinorrhea, sinus pressure, sneezing and trouble swallowing.   Eyes: Negative for photophobia.  Respiratory: Negative for cough, chest tightness, shortness of breath and stridor.   Cardiovascular: Negative for palpitations and leg swelling.  Gastrointestinal: Negative for constipation, blood in stool, abdominal distention and anal bleeding.  Genitourinary: Negative for dysuria, hematuria, flank pain and difficulty urinating.  Musculoskeletal: Negative for myalgias, back pain, joint swelling, arthralgias and gait problem.  Skin: Negative for color change, pallor and wound.  Neurological: Negative for dizziness, tremors, weakness and light-headedness.  Hematological: Negative for adenopathy. Does not bruise/bleed easily.  Psychiatric/Behavioral: Negative for behavioral problems, confusion, sleep disturbance, dysphoric mood, decreased concentration and agitation. The patient is not nervous/anxious.        Objective:   Physical Exam  Constitutional:  He is oriented to person, place, and time. He appears well-developed and well-nourished. No distress.  HENT:  Head: Normocephalic and atraumatic.  Mouth/Throat: Oropharynx is clear and moist. No oropharyngeal exudate.  Eyes: Conjunctivae and EOM are normal. No scleral icterus.  Neck: Normal range of motion. Neck supple. No JVD present.  Cardiovascular: Normal rate and regular rhythm.   Pulmonary/Chest: Effort normal. No respiratory distress. He has no wheezes.  Abdominal: He  exhibits no distension.  Musculoskeletal: He exhibits no edema or tenderness.  Lymphadenopathy:    He has no cervical adenopathy.  Neurological: He is alert and oriented to person, place, and time. He exhibits normal muscle tone. Coordination normal.  Skin: Skin is warm and dry. He is not diaphoretic. No erythema. No pallor.  Psychiatric: He has a normal mood and affect. His behavior is normal. Judgment and thought content normal.        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 Allyne Gee.

## 2015-08-05 ENCOUNTER — Other Ambulatory Visit: Payer: Self-pay | Admitting: Infectious Disease

## 2015-08-05 DIAGNOSIS — B20 Human immunodeficiency virus [HIV] disease: Secondary | ICD-10-CM

## 2015-10-01 ENCOUNTER — Other Ambulatory Visit: Payer: Self-pay | Admitting: Infectious Disease

## 2016-01-27 ENCOUNTER — Ambulatory Visit: Payer: Self-pay

## 2016-01-28 ENCOUNTER — Encounter: Payer: Self-pay | Admitting: Infectious Disease

## 2016-03-02 ENCOUNTER — Other Ambulatory Visit: Payer: Self-pay | Admitting: Infectious Disease

## 2016-03-02 DIAGNOSIS — B20 Human immunodeficiency virus [HIV] disease: Secondary | ICD-10-CM

## 2016-04-01 ENCOUNTER — Other Ambulatory Visit: Payer: Self-pay | Admitting: Infectious Disease

## 2016-04-01 DIAGNOSIS — B20 Human immunodeficiency virus [HIV] disease: Secondary | ICD-10-CM

## 2016-05-05 ENCOUNTER — Other Ambulatory Visit: Payer: Self-pay | Admitting: Infectious Disease

## 2016-05-05 DIAGNOSIS — B20 Human immunodeficiency virus [HIV] disease: Secondary | ICD-10-CM

## 2016-05-08 ENCOUNTER — Other Ambulatory Visit: Payer: Self-pay | Admitting: *Deleted

## 2016-05-10 ENCOUNTER — Other Ambulatory Visit: Payer: Self-pay | Admitting: Infectious Disease

## 2016-05-10 DIAGNOSIS — B20 Human immunodeficiency virus [HIV] disease: Secondary | ICD-10-CM

## 2016-05-20 ENCOUNTER — Other Ambulatory Visit: Payer: Self-pay | Admitting: Infectious Disease

## 2016-05-20 DIAGNOSIS — B20 Human immunodeficiency virus [HIV] disease: Secondary | ICD-10-CM

## 2016-05-22 ENCOUNTER — Telehealth: Payer: Self-pay | Admitting: *Deleted

## 2016-05-22 ENCOUNTER — Other Ambulatory Visit: Payer: Self-pay | Admitting: Infectious Disease

## 2016-05-22 DIAGNOSIS — B20 Human immunodeficiency virus [HIV] disease: Secondary | ICD-10-CM

## 2016-05-22 NOTE — Telephone Encounter (Signed)
Curtis PufferJose S, [facility] to confirm regimen and request refills. Patient  Has not been seen in 1 year, was supposed to follow up 4 days later. Patient has had medication delivered 2/12, 1/11, 12/9, 10/2, 9/5, 8/7. RN authorized 30 day supply of Prezcobix and Descovy, additional refills only when he contacts the office. RN attempted to call patient, his voicemail is not yet set up. Pharmacist will relay the information to the patient.  Patient needs ADAP/RW renewal as well. Andree CossHowell, Ryken Paschal M, RN

## 2016-08-14 ENCOUNTER — Ambulatory Visit: Payer: Self-pay

## 2016-08-15 ENCOUNTER — Ambulatory Visit (INDEPENDENT_AMBULATORY_CARE_PROVIDER_SITE_OTHER): Payer: Self-pay | Admitting: Pharmacist Clinician (PhC)/ Clinical Pharmacy Specialist

## 2016-08-15 ENCOUNTER — Encounter: Payer: Self-pay | Admitting: Infectious Disease

## 2016-08-15 DIAGNOSIS — B20 Human immunodeficiency virus [HIV] disease: Secondary | ICD-10-CM

## 2016-08-15 LAB — CBC WITH DIFFERENTIAL/PLATELET
BASOS PCT: 1 %
Basophils Absolute: 63 cells/uL (ref 0–200)
Eosinophils Absolute: 441 cells/uL (ref 15–500)
Eosinophils Relative: 7 %
HEMATOCRIT: 43.4 % (ref 38.5–50.0)
HEMOGLOBIN: 15 g/dL (ref 13.2–17.1)
Lymphocytes Relative: 28 %
Lymphs Abs: 1764 cells/uL (ref 850–3900)
MCH: 33.2 pg — ABNORMAL HIGH (ref 27.0–33.0)
MCHC: 34.6 g/dL (ref 32.0–36.0)
MCV: 96 fL (ref 80.0–100.0)
MONO ABS: 693 {cells}/uL (ref 200–950)
MPV: 10.1 fL (ref 7.5–12.5)
Monocytes Relative: 11 %
NEUTROS PCT: 53 %
Neutro Abs: 3339 cells/uL (ref 1500–7800)
Platelets: 298 10*3/uL (ref 140–400)
RBC: 4.52 MIL/uL (ref 4.20–5.80)
RDW: 13.4 % (ref 11.0–15.0)
WBC: 6.3 10*3/uL (ref 3.8–10.8)

## 2016-08-15 LAB — COMPLETE METABOLIC PANEL WITH GFR
ALBUMIN: 4.6 g/dL (ref 3.6–5.1)
ALK PHOS: 87 U/L (ref 40–115)
ALT: 33 U/L (ref 9–46)
AST: 32 U/L (ref 10–40)
BUN: 12 mg/dL (ref 7–25)
CALCIUM: 9.3 mg/dL (ref 8.6–10.3)
CO2: 25 mmol/L (ref 20–31)
CREATININE: 0.6 mg/dL (ref 0.60–1.35)
Chloride: 103 mmol/L (ref 98–110)
GFR, Est African American: >89 mL/min (ref >=60)
GFR, Est Non African American: >89 mL/min (ref >=60)
Glucose, Bld: 93 mg/dL (ref 65–99)
Potassium: 4.3 mmol/L (ref 3.5–5.3)
Sodium: 137 mmol/L (ref 135–146)
TOTAL PROTEIN: 7.5 g/dL (ref 6.1–8.1)
Total Bilirubin: 0.7 mg/dL (ref 0.2–1.2)

## 2016-08-15 MED ORDER — EMTRICITABINE-TENOFOVIR AF 200-25 MG PO TABS: 1.0000 | ORAL_TABLET | Freq: Every day | ORAL | 2 refills | Status: DC

## 2016-08-15 MED ORDER — DARUNAVIR-COBICISTAT 800-150 MG PO TABS: 1.0000 | ORAL_TABLET | Freq: Every day | ORAL | 2 refills | Status: DC

## 2016-08-15 NOTE — Patient Instructions (Signed)
[facility] will mail you your meds until ADAP is approved You must COME BACK for a visit with Dr. Daiva EvesVan Dam

## 2016-08-15 NOTE — Addendum Note (Signed)
Addended byJimmy Picket: ABBITT, KATRINA F on: 08/15/2016 10:45 AM   Modules accepted: Orders

## 2016-08-15 NOTE — Progress Notes (Signed)
HPI: Curtis Burton is a 36 y.o. male who is here with his wife after not being seen in over a year.   Allergies: No Known Allergies  Vitals:    Past Medical History: Past Medical History:  Diagnosis Date  . Anemia   . HIV infection (HCC)   . Retinitis    PERIFERAL FOCAL  . UTI (urinary tract infection)     Social History: Social History   Social History  . Marital status: Single    Spouse name: N/A  . Number of children: N/A  . Years of education: N/A   Social History Main Topics  . Smoking status: Current Some Day Smoker    Packs/day: 0.25    Years: 15.00    Types: Cigarettes  . Smokeless tobacco: Never Used     Comment: slowing down on ciggs  . Alcohol use 1.2 oz/week    2 Shots of liquor per week     Comment: weekends  . Drug use: No  . Sexual activity: Yes    Partners: Female     Comment: pt declined condoms   Other Topics Concern  . Not on file   Social History Narrative  . No narrative on file    Previous Regimen: ATP, Genvoya, Stribild  Current Regimen: Prezcobix/Descovy  Labs: HIV 1 RNA Quant (copies/mL)  Date Value  04/12/2015 <20  09/14/2014 <20  07/13/2014 <20   CD4 T Cell Abs (/uL)  Date Value  04/12/2015 210 (L)  09/14/2014 250 (L)  07/13/2014 200 (L)   Hepatitis B Surface Ag (no units)  Date Value  03/08/2012 NEGATIVE   HCV Ab (no units)  Date Value  03/08/2012 NEGATIVE    CrCl: CrCl cannot be calculated (Patient's most recent lab result is older than the maximum 21 days allowed.).  Lipids:    Component Value Date/Time   CHOL 191 04/12/2015 0945   TRIG 146 04/12/2015 0945   HDL 56 04/12/2015 0945   CHOLHDL 3.4 04/12/2015 0945   VLDL 29 04/12/2015 0945   LDLCALC 106 04/12/2015 0945    Assessment: Curtis Burton was doing well on his Stribild before developing m184v. Subsequently, he was switched to Prez/Descovy. He is well suppressed on therapy. His compliance has been very good without missing doses, side  effects, etc. Neither one of them have seen us here in over a year so Olegario MessierKathy has just renewed his ADAP. Both of them only have about 4 days left of therapy. [facility]. Told them that they have to keep their appts to prevent this sort of things from happening. We'll get all labs today.   Recommendations:  Cont Prezcobix/Descovy [facility]  Ulyses SouthwardMinh Gavin Faivre, PharmD, BCPS, AAHIVP, CPP Clinical Infectious Disease Pharmacist [facility] 08/15/2016, 10:15 AM

## 2016-08-15 NOTE — Addendum Note (Signed)
Addended byJimmy Picket: Burton, Curtis F on: 08/15/2016 10:30 AM   Modules accepted: Orders

## 2016-08-16 LAB — RPR

## 2016-08-16 LAB — URINE CYTOLOGY ANCILLARY ONLY
Chlamydia: NEGATIVE
Neisseria Gonorrhea: NEGATIVE

## 2016-08-16 LAB — T-HELPER CELL (CD4) - (RCID CLINIC ONLY)
CD4 T CELL HELPER: 18 % — AB (ref 33–55)
CD4 T Cell Abs: 360 /uL — ABNORMAL LOW (ref 400–2700)

## 2016-08-18 LAB — HIV-1 RNA QUANT-NO REFLEX-BLD
HIV 1 RNA Quant: 38 copies/mL — ABNORMAL HIGH
HIV-1 RNA Quant, Log: 1.58 Log copies/mL — ABNORMAL HIGH

## 2016-09-01 ENCOUNTER — Ambulatory Visit (INDEPENDENT_AMBULATORY_CARE_PROVIDER_SITE_OTHER): Payer: Self-pay | Admitting: Infectious Disease

## 2016-09-01 ENCOUNTER — Encounter: Payer: Self-pay | Admitting: Infectious Disease

## 2016-09-01 VITALS — BP 115/78 | HR 83 | Temp 98.3°F | Ht 64.0 in | Wt 144.0 lb

## 2016-09-01 DIAGNOSIS — Z79899 Other long term (current) drug therapy: Secondary | ICD-10-CM

## 2016-09-01 DIAGNOSIS — Z113 Encounter for screening for infections with a predominantly sexual mode of transmission: Secondary | ICD-10-CM

## 2016-09-01 DIAGNOSIS — H30033 Focal chorioretinal inflammation, peripheral, bilateral: Secondary | ICD-10-CM

## 2016-09-01 DIAGNOSIS — B20 Human immunodeficiency virus [HIV] disease: Secondary | ICD-10-CM

## 2016-09-01 DIAGNOSIS — Z23 Encounter for immunization: Secondary | ICD-10-CM

## 2016-09-01 MED ORDER — DARUNAVIR-COBICISTAT 800-150 MG PO TABS: 1.0000 | ORAL_TABLET | Freq: Every day | ORAL | 11 refills | Status: DC

## 2016-09-01 MED ORDER — EMTRICITABINE-TENOFOVIR AF 200-25 MG PO TABS: 1.0000 | ORAL_TABLET | Freq: Every day | ORAL | 11 refills | Status: DC

## 2016-09-01 NOTE — Addendum Note (Signed)
Addended by: Linnell FullingBRANNON, LATOYA N on: 09/01/2016 12:55 PM   Modules accepted: Orders

## 2016-09-01 NOTE — Progress Notes (Signed)
Subjective:   Chief complaint: followup for HIV on medications   Patient ID: Curtis Burton, male    DOB: 06/01/1980, 36 y.o.   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   He was being followed by   Dr. Allyne Burton for CMV. His HIV had been perfectly suppressed on Atripla. [facility] and he maintained healthy virological suppression and now his CD4 count is above 200 but then has had virological failure when checked in  with 184 V, viral load did drop further since then but I have concern he may have developed INSTI resistance in the interim. He has since been on Prezcobix and Descovy with healthy virological suppresion. He and Curtis Burton neglected to get ADAP done this Spring but somehow managed to still have meds? His virus was 38 when checked by Curtis Burton.  Lab Results  Component Value Date   HIV1RNAQUANT 38 (H) 08/15/2016   HIV1RNAQUANT <20 04/12/2015   HIV1RNAQUANT <20 09/14/2014      Lab Results  Component Value Date   CD4TABS 360 (L) 08/15/2016   CD4TABS 210 (L) 04/12/2015   CD4TABS 250 (L) 09/14/2014      Past Medical History:  Diagnosis Date  . Anemia   . HIV infection (HCC)   . Retinitis    PERIFERAL FOCAL  . UTI (urinary tract infection)     Past Surgical History:  Procedure Laterality Date  . NO PAST SURGERIES      No family history on file.    Social History   Social History  . Marital status: Single    Spouse name: N/A  . Number of children: N/A  . Years of education: N/A   Social History Main Topics  . Smoking status: Current Some Day Smoker    Packs/day: 0.25    Years: 15.00    Types: Cigarettes  . Smokeless tobacco: Never Used     Comment: slowing down on ciggs  . Alcohol use 1.2 oz/week    2 Shots of liquor per week     Comment: weekends  . Drug use: No  . Sexual activity: Yes    Partners: Female     Comment: pt declined condoms    Other Topics Concern  . Not on file   Social History Narrative  . No narrative on file    No Known Allergies   Current Outpatient Prescriptions:  .  darunavir-cobicistat (PREZCOBIX) 800-150 MG per tablet, Take 1 tablet by mouth daily. Swallow whole. Do NOT crush, break or chew tablets. Take with food., Disp: 30 tablet, Rfl: 5 .  darunavir-cobicistat (PREZCOBIX) 800-150 MG tablet, Take 1 tablet by mouth daily. Swallow whole. Do NOT crush, break or chew tablets. Take with food., Disp: 30 tablet, Rfl: 2 .  DESCOVY 200-25 MG tablet, TAKE 1 TABLET BY MOUTH DAILY, Disp: 30 tablet, Rfl: 5 .  emtricitabine-tenofovir AF (DESCOVY) 200-25 MG tablet, Take 1 tablet by mouth daily., Disp: 30 tablet, Rfl: 2   Review of Systems  Constitutional: Negative for activity change, appetite change, chills, diaphoresis, fatigue and unexpected weight change.  HENT: Negative for rhinorrhea, sinus pressure, sneezing and trouble swallowing.   Eyes: Negative for photophobia.  Respiratory: Negative for cough, chest tightness, shortness of breath and stridor.   Cardiovascular: Negative for palpitations and leg swelling.  Gastrointestinal: Negative for abdominal distention, anal bleeding, blood in stool and constipation.  Musculoskeletal: Negative for arthralgias,  back pain, gait problem, joint swelling and myalgias.  Skin: Negative for color change, pallor and wound.  Neurological: Negative for dizziness, tremors, weakness and light-headedness.  Hematological: Negative for adenopathy. Does not bruise/bleed easily.  Psychiatric/Behavioral: Negative for agitation, behavioral problems, confusion, decreased concentration, dysphoric mood and sleep disturbance. The patient is not nervous/anxious.        Objective:   Physical Exam  Constitutional: He is oriented to person, place, and time. He appears well-developed and well-nourished. No distress.  HENT:  Head: Normocephalic and atraumatic.  Mouth/Throat:  Oropharynx is clear and moist. No oropharyngeal exudate.  Eyes: Conjunctivae and EOM are normal. No scleral icterus.  Neck: Normal range of motion.  Cardiovascular: Normal rate and regular rhythm.   Pulmonary/Chest: Effort normal. No respiratory distress. He has no wheezes.  Abdominal: He exhibits no distension.  Musculoskeletal: He exhibits no edema or tenderness.  Neurological: He is alert and oriented to person, place, and time. He exhibits normal muscle tone. Coordination normal.  Skin: Skin is warm and dry. He is not diaphoretic. No erythema. No pallor.  Psychiatric: He has a normal mood and affect. His behavior is normal. Judgment and thought content normal.        Assessment & Plan:  HIV:  continue PREZCOBIX and  DESCOVY, bring back in 3 months   CMV retinitis:  Sp treatment

## 2016-12-13 ENCOUNTER — Encounter: Payer: Self-pay | Admitting: Infectious Disease

## 2016-12-13 ENCOUNTER — Ambulatory Visit (INDEPENDENT_AMBULATORY_CARE_PROVIDER_SITE_OTHER): Payer: Self-pay | Admitting: Infectious Disease

## 2016-12-13 VITALS — BP 126/73 | HR 99 | Temp 98.7°F | Wt 148.0 lb

## 2016-12-13 DIAGNOSIS — B20 Human immunodeficiency virus [HIV] disease: Secondary | ICD-10-CM

## 2016-12-13 DIAGNOSIS — Z23 Encounter for immunization: Secondary | ICD-10-CM

## 2016-12-13 DIAGNOSIS — B259 Cytomegaloviral disease, unspecified: Secondary | ICD-10-CM

## 2016-12-13 DIAGNOSIS — H3090 Unspecified chorioretinal inflammation, unspecified eye: Secondary | ICD-10-CM

## 2016-12-13 NOTE — Progress Notes (Signed)
Subjective:   Chief complaint: followup for HIV on medications   Patient ID: Curtis Burton, male    DOB: August 05, 1980, 36 y.o.   MRN: [id number]  HPI  36 year-old Hispanic man withdiagnosed HIV AIDS CMV retinitis and  PCP pneumonia (latter dx made clinically).   He and [name] neglected to get ADAP done this Spring but somehow managed to still have meds? His virus was 38 when checked by Ulyses Southward. He claims he is being highly adherent to his medications and he and on a have both renewed her ADAP.  Lab Results  Component Value Date   HIV1RNAQUANT 38 (H) 08/15/2016   HIV1RNAQUANT <20 04/12/2015   HIV1RNAQUANT <20 09/14/2014      Lab Results  Component Value Date   CD4TABS 360 (L) 08/15/2016   CD4TABS 210 (L) 04/12/2015   CD4TABS 250 (L) 09/14/2014      Past Medical History:  Diagnosis Date  . Anemia   . HIV infection (HCC)   . Retinitis    PERIFERAL FOCAL  . UTI (urinary tract infection)     Past Surgical History:  Procedure Laterality Date  . NO PAST SURGERIES      No family history on file.    Social History   Social History  . Marital status: Single    Spouse name: N/A  . Number of children: N/A  . Years of education: N/A   Social History Main Topics  . Smoking status: Current Every Day Smoker    Packs/day: 0.25    Years: 15.00    Types: Cigarettes  . Smokeless tobacco: Never Used     Comment: slowing down on ciggs  . Alcohol use 1.2 oz/week    2 Shots of liquor per week     Comment: weekends  . Drug use: No  . Sexual activity: Yes    Partners: Female     Comment: pt declined condoms   Other Topics Concern  . None   Social History Narrative  . None    No Known Allergies   Current Outpatient Prescriptions:  .  darunavir-cobicistat (PREZCOBIX) 800-150 MG tablet, Take 1 tablet by mouth daily with breakfast. Take with food., Disp: 30 tablet, Rfl: 11 .  emtricitabine-tenofovir AF (DESCOVY) 200-25 MG tablet, Take 1 tablet by mouth  daily., Disp: 30 tablet, Rfl: 11   Review of Systems  Constitutional: Negative for activity change, appetite change, chills, diaphoresis, fatigue and unexpected weight change.  HENT: Negative for rhinorrhea, sinus pressure, sneezing and trouble swallowing.   Eyes: Negative for photophobia.  Respiratory: Negative for cough, chest tightness, shortness of breath and stridor.   Cardiovascular: Negative for palpitations and leg swelling.  Gastrointestinal: Negative for abdominal distention, anal bleeding, blood in stool and constipation.  Musculoskeletal: Negative for arthralgias, back pain, gait problem, joint swelling and myalgias.  Skin: Negative for color change, pallor and wound.  Neurological: Negative for dizziness, tremors, weakness and light-headedness.  Hematological: Negative for adenopathy. Does not bruise/bleed easily.  Psychiatric/Behavioral: Negative for agitation, behavioral problems, confusion, decreased concentration, dysphoric mood and sleep disturbance. The patient is not nervous/anxious.        Objective:   Physical Exam  Constitutional: He is oriented to person, place, and time. He appears well-developed and well-nourished. No distress.  HENT:  Head: Normocephalic and atraumatic.  Mouth/Throat: Oropharynx is clear and moist. No oropharyngeal exudate.  Eyes: Conjunctivae and EOM are normal. No scleral icterus.  Neck: Normal range of motion.  Cardiovascular: Normal rate and regular  rhythm.   Pulmonary/Chest: Effort normal. No respiratory distress. He has no wheezes.  Abdominal: He exhibits no distension.  Musculoskeletal: He exhibits no edema or tenderness.  Neurological: He is alert and oriented to person, place, and time. He exhibits normal muscle tone. Coordination normal.  Skin: Skin is warm and dry. He is not diaphoretic. No erythema. No pallor.  Psychiatric: He has a normal mood and affect. His behavior is normal. Judgment and thought content normal.         Assessment & Plan:  HIV:  continue PREZCOBIX and  DESCOVY, bring back in 3 months. [facility] but ADAP not covering this yet  CMV retinitis:  Sp treatment

## 2016-12-14 LAB — CBC WITH DIFFERENTIAL/PLATELET
BASOS ABS: 41 {cells}/uL (ref 0–200)
Basophils Relative: 0.6 %
EOS ABS: 347 {cells}/uL (ref 15–500)
Eosinophils Relative: 5.1 %
HEMATOCRIT: 40.9 % (ref 38.5–50.0)
Hemoglobin: 14.6 g/dL (ref 13.2–17.1)
Lymphs Abs: 1523 cells/uL (ref 850–3900)
MCH: 33.6 pg — AB (ref 27.0–33.0)
MCHC: 35.7 g/dL (ref 32.0–36.0)
MCV: 94 fL (ref 80.0–100.0)
MPV: 10.8 fL (ref 7.5–12.5)
Monocytes Relative: 7.6 %
NEUTROS PCT: 64.3 %
Neutro Abs: 4372 cells/uL (ref 1500–7800)
PLATELETS: 293 10*3/uL (ref 140–400)
RBC: 4.35 10*6/uL (ref 4.20–5.80)
RDW: 12.1 % (ref 11.0–15.0)
TOTAL LYMPHOCYTE: 22.4 %
WBC: 6.8 10*3/uL (ref 3.8–10.8)
WBCMIX: 517 {cells}/uL (ref 200–950)

## 2016-12-14 LAB — COMPLETE METABOLIC PANEL WITH GFR
AG RATIO: 1.7 (calc) (ref 1.0–2.5)
ALT: 26 U/L (ref 9–46)
AST: 24 U/L (ref 10–40)
Albumin: 4.6 g/dL (ref 3.6–5.1)
Alkaline phosphatase (APISO): 83 U/L (ref 40–115)
BUN: 11 mg/dL (ref 7–25)
CALCIUM: 9.2 mg/dL (ref 8.6–10.3)
CO2: 25 mmol/L (ref 20–32)
CREATININE: 0.68 mg/dL (ref 0.60–1.35)
Chloride: 104 mmol/L (ref 98–110)
GFR, EST AFRICAN AMERICAN: 140 mL/min/{1.73_m2} (ref >=60)
GFR, EST NON AFRICAN AMERICAN: 121 mL/min/{1.73_m2} (ref >=60)
GLUCOSE: 159 mg/dL — AB (ref 65–99)
Globulin: 2.7 g/dL (calc) (ref 1.9–3.7)
POTASSIUM: 4.3 mmol/L (ref 3.5–5.3)
Sodium: 136 mmol/L (ref 135–146)
TOTAL PROTEIN: 7.3 g/dL (ref 6.1–8.1)
Total Bilirubin: 0.8 mg/dL (ref 0.2–1.2)

## 2016-12-14 LAB — LIPID PANEL
Cholesterol: 200 mg/dL — ABNORMAL HIGH (ref <200)
HDL: 66 mg/dL (ref >40)
LDL Cholesterol (Calc): 112 mg/dL (calc) — ABNORMAL HIGH
NON-HDL CHOLESTEROL (CALC): 134 mg/dL — AB (ref <130)
Total CHOL/HDL Ratio: 3 (calc) (ref <5.0)
Triglycerides: 108 mg/dL (ref <150)

## 2016-12-14 LAB — RPR: RPR Ser Ql: NONREACTIVE

## 2016-12-14 LAB — URINE CYTOLOGY ANCILLARY ONLY
CHLAMYDIA, DNA PROBE: NEGATIVE
NEISSERIA GONORRHEA: NEGATIVE

## 2016-12-15 LAB — HIV-1 RNA ULTRAQUANT REFLEX TO GENTYP+
HIV 1 RNA Quant: <20 Copies/mL — ABNORMAL HIGH
HIV-1 RNA Quant, Log: <1.3 Log cps/mL — ABNORMAL HIGH

## 2016-12-15 LAB — T-HELPER CELL (CD4) - (RCID CLINIC ONLY)
CD4 % Helper T Cell: 18 % — ABNORMAL LOW (ref 33–55)
CD4 T Cell Abs: 290 /uL — ABNORMAL LOW (ref 400–2700)

## 2017-03-07 ENCOUNTER — Other Ambulatory Visit: Payer: Self-pay

## 2017-03-07 DIAGNOSIS — H30033 Focal chorioretinal inflammation, peripheral, bilateral: Secondary | ICD-10-CM

## 2017-03-07 DIAGNOSIS — Z79899 Other long term (current) drug therapy: Secondary | ICD-10-CM

## 2017-03-07 DIAGNOSIS — Z113 Encounter for screening for infections with a predominantly sexual mode of transmission: Secondary | ICD-10-CM

## 2017-03-07 DIAGNOSIS — B20 Human immunodeficiency virus [HIV] disease: Secondary | ICD-10-CM

## 2017-03-08 LAB — COMPLETE METABOLIC PANEL WITH GFR
AG Ratio: 1.5 (calc) (ref 1.0–2.5)
ALBUMIN MSPROF: 4.1 g/dL (ref 3.6–5.1)
ALT: 20 U/L (ref 9–46)
AST: 17 U/L (ref 10–40)
Alkaline phosphatase (APISO): 84 U/L (ref 40–115)
BUN: 13 mg/dL (ref 7–25)
CALCIUM: 9.1 mg/dL (ref 8.6–10.3)
CO2: 27 mmol/L (ref 20–32)
Chloride: 103 mmol/L (ref 98–110)
Creat: 0.63 mg/dL (ref 0.60–1.35)
GFR, EST AFRICAN AMERICAN: 145 mL/min/{1.73_m2} (ref >=60)
GFR, EST NON AFRICAN AMERICAN: 125 mL/min/{1.73_m2} (ref >=60)
Globulin: 2.7 g/dL (calc) (ref 1.9–3.7)
Glucose, Bld: 96 mg/dL (ref 65–99)
Potassium: 4.6 mmol/L (ref 3.5–5.3)
Sodium: 138 mmol/L (ref 135–146)
TOTAL PROTEIN: 6.8 g/dL (ref 6.1–8.1)
Total Bilirubin: 0.5 mg/dL (ref 0.2–1.2)

## 2017-03-08 LAB — RPR: RPR Ser Ql: NONREACTIVE

## 2017-03-08 LAB — CBC WITH DIFFERENTIAL/PLATELET
BASOS PCT: 0.5 %
Basophils Absolute: 38 cells/uL (ref 0–200)
EOS ABS: 353 {cells}/uL (ref 15–500)
Eosinophils Relative: 4.7 %
HEMATOCRIT: 39.7 % (ref 38.5–50.0)
HEMOGLOBIN: 14.1 g/dL (ref 13.2–17.1)
LYMPHS ABS: 1845 {cells}/uL (ref 850–3900)
MCH: 32.9 pg (ref 27.0–33.0)
MCHC: 35.5 g/dL (ref 32.0–36.0)
MCV: 92.5 fL (ref 80.0–100.0)
MPV: 11.1 fL (ref 7.5–12.5)
Monocytes Relative: 8.2 %
NEUTROS ABS: 4650 {cells}/uL (ref 1500–7800)
Neutrophils Relative %: 62 %
PLATELETS: 313 10*3/uL (ref 140–400)
RBC: 4.29 10*6/uL (ref 4.20–5.80)
RDW: 12 % (ref 11.0–15.0)
TOTAL LYMPHOCYTE: 24.6 %
WBC: 7.5 10*3/uL (ref 3.8–10.8)
WBCMIX: 615 {cells}/uL (ref 200–950)

## 2017-03-08 LAB — URINE CYTOLOGY ANCILLARY ONLY
Chlamydia: NEGATIVE
NEISSERIA GONORRHEA: NEGATIVE

## 2017-03-08 LAB — LIPID PANEL
Cholesterol: 167 mg/dL (ref <200)
HDL: 57 mg/dL (ref >40)
LDL CHOLESTEROL (CALC): 90 mg/dL
NON-HDL CHOLESTEROL (CALC): 110 mg/dL (ref <130)
TRIGLYCERIDES: 107 mg/dL (ref <150)
Total CHOL/HDL Ratio: 2.9 (calc) (ref <5.0)

## 2017-03-08 LAB — T-HELPER CELL (CD4) - (RCID CLINIC ONLY)
CD4 T CELL ABS: 260 /uL — AB (ref 400–2700)
CD4 T CELL HELPER: 13 % — AB (ref 33–55)

## 2017-03-09 LAB — HIV-1 RNA QUANT-NO REFLEX-BLD
HIV 1 RNA Quant: <20 copies/mL
HIV-1 RNA Quant, Log: <1.3 Log copies/mL

## 2017-03-21 ENCOUNTER — Ambulatory Visit: Payer: Self-pay | Admitting: Infectious Disease

## 2017-04-19 ENCOUNTER — Encounter: Payer: Self-pay | Admitting: Infectious Disease

## 2017-08-10 ENCOUNTER — Other Ambulatory Visit: Payer: Self-pay | Admitting: Infectious Disease

## 2017-08-10 ENCOUNTER — Other Ambulatory Visit: Payer: Self-pay | Admitting: *Deleted

## 2017-08-10 DIAGNOSIS — B20 Human immunodeficiency virus [HIV] disease: Secondary | ICD-10-CM

## 2017-08-10 MED ORDER — EMTRICITABINE-TENOFOVIR AF 200-25 MG PO TABS: 1.0000 | ORAL_TABLET | Freq: Every day | ORAL | 1 refills | Status: DC

## 2017-08-10 MED ORDER — DARUNAVIR-COBICISTAT 800-150 MG PO TABS: 1.0000 | ORAL_TABLET | Freq: Every day | ORAL | 1 refills | Status: DC

## 2017-11-05 ENCOUNTER — Telehealth: Payer: Self-pay

## 2017-11-05 DIAGNOSIS — B20 Human immunodeficiency virus [HIV] disease: Secondary | ICD-10-CM

## 2017-11-05 MED ORDER — EMTRICITABINE-TENOFOVIR AF 200-25 MG PO TABS: 1.0000 | ORAL_TABLET | Freq: Every day | ORAL | 0 refills | Status: DC

## 2017-11-05 MED ORDER — DARUNAVIR-COBICISTAT 800-150 MG PO TABS: 1.0000 | ORAL_TABLET | Freq: Every day | ORAL | 1 refills | Status: DC

## 2017-11-05 NOTE — Telephone Encounter (Signed)
He needs to stay on meds. We can also change him to [facility] since it is on formulary now for [facility]

## 2017-11-05 NOTE — Telephone Encounter (Signed)
Called patient to schedule an appt for patient to see Artist, lab, and [employer], Np. Patient was last seen in office 12/13/16, and no showed appointment with Dr. Daiva Eves on 03/21/17. Patient was able to schedule appointments during my call and was able to confirm with assistance from [place], scheduling.   Called [facility] to find out when patient had last refilled prezobix and descovy. Last refill was on 8/14 no refills left on medicine. Will send in refill for medication additional refills will be submitted after appointment scheduled.  Lorenso Courier, [place]

## 2017-11-06 ENCOUNTER — Other Ambulatory Visit: Payer: Self-pay | Admitting: *Deleted

## 2017-11-06 DIAGNOSIS — B20 Human immunodeficiency virus [HIV] disease: Secondary | ICD-10-CM

## 2017-11-07 ENCOUNTER — Ambulatory Visit: Payer: Self-pay

## 2017-11-07 ENCOUNTER — Encounter: Payer: Self-pay | Admitting: Infectious Diseases

## 2017-11-07 ENCOUNTER — Other Ambulatory Visit: Payer: Self-pay

## 2017-11-07 DIAGNOSIS — B20 Human immunodeficiency virus [HIV] disease: Secondary | ICD-10-CM

## 2017-11-08 LAB — URINE CYTOLOGY ANCILLARY ONLY
Chlamydia: NEGATIVE
NEISSERIA GONORRHEA: NEGATIVE

## 2017-11-09 LAB — COMPLETE METABOLIC PANEL WITH GFR
AG Ratio: 1.6 (calc) (ref 1.0–2.5)
ALBUMIN MSPROF: 4.3 g/dL (ref 3.6–5.1)
ALT: 32 U/L (ref 9–46)
AST: 31 U/L (ref 10–40)
Alkaline phosphatase (APISO): 89 U/L (ref 40–115)
BUN / CREAT RATIO: 8 (calc) (ref 6–22)
BUN: 6 mg/dL — AB (ref 7–25)
CALCIUM: 9.2 mg/dL (ref 8.6–10.3)
CO2: 25 mmol/L (ref 20–32)
CREATININE: 0.72 mg/dL (ref 0.60–1.35)
Chloride: 102 mmol/L (ref 98–110)
GFR, EST NON AFRICAN AMERICAN: 118 mL/min/{1.73_m2} (ref >=60)
GFR, Est African American: 136 mL/min/{1.73_m2} (ref >=60)
GLOBULIN: 2.7 g/dL (ref 1.9–3.7)
GLUCOSE: 83 mg/dL (ref 65–99)
Potassium: 4.1 mmol/L (ref 3.5–5.3)
SODIUM: 138 mmol/L (ref 135–146)
Total Bilirubin: 0.4 mg/dL (ref 0.2–1.2)
Total Protein: 7 g/dL (ref 6.1–8.1)

## 2017-11-09 LAB — CBC WITH DIFFERENTIAL/PLATELET
BASOS PCT: 0.7 %
Basophils Absolute: 49 cells/uL (ref 0–200)
EOS ABS: 350 {cells}/uL (ref 15–500)
Eosinophils Relative: 5 %
HEMATOCRIT: 40.1 % (ref 38.5–50.0)
Hemoglobin: 14.3 g/dL (ref 13.2–17.1)
LYMPHS ABS: 2142 {cells}/uL (ref 850–3900)
MCH: 33.1 pg — AB (ref 27.0–33.0)
MCHC: 35.7 g/dL (ref 32.0–36.0)
MCV: 92.8 fL (ref 80.0–100.0)
MPV: 10.2 fL (ref 7.5–12.5)
Monocytes Relative: 8.9 %
NEUTROS PCT: 54.8 %
Neutro Abs: 3836 cells/uL (ref 1500–7800)
PLATELETS: 309 10*3/uL (ref 140–400)
RBC: 4.32 10*6/uL (ref 4.20–5.80)
RDW: 12.3 % (ref 11.0–15.0)
Total Lymphocyte: 30.6 %
WBC: 7 10*3/uL (ref 3.8–10.8)
WBCMIX: 623 {cells}/uL (ref 200–950)

## 2017-11-09 LAB — HIV-1 RNA QUANT-NO REFLEX-BLD
HIV 1 RNA Quant: <20 copies/mL
HIV-1 RNA Quant, Log: <1.3 Log copies/mL

## 2017-11-19 ENCOUNTER — Ambulatory Visit: Payer: Self-pay | Admitting: Infectious Diseases

## 2017-11-20 ENCOUNTER — Encounter: Payer: Self-pay | Admitting: Infectious Disease

## 2017-11-20 ENCOUNTER — Ambulatory Visit (INDEPENDENT_AMBULATORY_CARE_PROVIDER_SITE_OTHER): Payer: Self-pay | Admitting: Infectious Disease

## 2017-11-20 VITALS — BP 138/79 | HR 87 | Temp 98.2°F | Ht 65.0 in | Wt 151.0 lb

## 2017-11-20 DIAGNOSIS — Z23 Encounter for immunization: Secondary | ICD-10-CM

## 2017-11-20 DIAGNOSIS — B20 Human immunodeficiency virus [HIV] disease: Secondary | ICD-10-CM

## 2017-11-20 MED ORDER — DARUN-COBIC-EMTRICIT-TENOFAF 800-150-200-10 MG PO TABS: 1.0000 | ORAL_TABLET | Freq: Every day | ORAL | 11 refills | Status: DC

## 2017-11-20 NOTE — Progress Notes (Signed)
Subjective:   Chief complaint: followup for HIV on medications   Patient ID: Curtis Burton, male    DOB: 1980-04-30, 37 y.o.   MRN: [id number]  HPI  37 year-old Hispanic man withdiagnosed HIV AIDS CMV retinitis and  PCP pneumonia (latter dx made clinically).   He and [name] have renewed HMAP remains with a undetectable viral load.  Lab Results  Component Value Date   HIV1RNAQUANT <20 NOT DETECTED 11/07/2017   HIV1RNAQUANT <20 NOT DETECTED 03/07/2017   HIV1RNAQUANT <20 (H) 12/13/2016      Lab Results  Component Value Date   CD4TABS 260 (L) 03/07/2017   CD4TABS 290 (L) 12/13/2016   CD4TABS 360 (L) 08/15/2016      Past Medical History:  Diagnosis Date  . Anemia   . HIV infection (HCC)   . Retinitis    PERIFERAL FOCAL  . UTI (urinary tract infection)     Past Surgical History:  Procedure Laterality Date  . NO PAST SURGERIES      No family history on file.    Social History   Socioeconomic History  . Marital status: Single    Spouse name: Not on file  . Number of children: Not on file  . Years of education: Not on file  . Highest education level: Not on file  Occupational History  . Not on file  Social Needs  . Financial resource strain: Not on file  . Food insecurity:    Worry: Not on file    Inability: Not on file  . Transportation needs:    Medical: Not on file    Non-medical: Not on file  Tobacco Use  . Smoking status: Current Every Day Smoker    Packs/day: 0.25    Years: 15.00    Pack years: 3.75    Types: Cigarettes  . Smokeless tobacco: Never Used  . Tobacco comment: slowing down on ciggs  Substance and Sexual Activity  . Alcohol use: Yes    Alcohol/week: 2.0 standard drinks    Types: 2 Shots of liquor per week    Comment: weekends  . Drug use: No  . Sexual activity: Yes    Partners: Female    Comment: pt declined condoms  Lifestyle  . Physical activity:    Days per week: Not on file    Minutes per session: Not on file  .  Stress: Not on file  Relationships  . Social connections:    Talks on phone: Not on file    Gets together: Not on file    Attends religious service: Not on file    Active member of club or organization: Not on file    Attends meetings of clubs or organizations: Not on file    Relationship status: Not on file  Other Topics Concern  . Not on file  Social History Narrative  . Not on file    No Known Allergies   Current Outpatient Medications:  .  Darunavir-Cobicisctat-Emtricitabine-Tenofovir Alafenamide (SYMTUZA) 800-150-200-10 MG TABS, Take 1 tablet by mouth daily with breakfast., Disp: 30 tablet, Rfl: 11   Review of Systems  Constitutional: Negative for activity change, appetite change, chills, diaphoresis, fatigue and unexpected weight change.  HENT: Negative for rhinorrhea, sinus pressure, sneezing and trouble swallowing.   Eyes: Negative for photophobia.  Respiratory: Negative for cough, chest tightness, shortness of breath and stridor.   Cardiovascular: Negative for palpitations and leg swelling.  Gastrointestinal: Negative for abdominal distention, anal bleeding, blood in stool and constipation.  Musculoskeletal:  Negative for arthralgias, back pain, gait problem, joint swelling and myalgias.  Skin: Negative for color change, pallor and wound.  Neurological: Negative for dizziness, tremors, weakness and light-headedness.  Hematological: Negative for adenopathy. Does not bruise/bleed easily.  Psychiatric/Behavioral: Negative for agitation, behavioral problems, confusion, decreased concentration, dysphoric mood and sleep disturbance. The patient is not nervous/anxious.        Objective:   Physical Exam  Constitutional: He is oriented to person, place, and time. He appears well-developed and well-nourished. No distress.  HENT:  Head: Normocephalic and atraumatic.  Mouth/Throat: Oropharynx is clear and moist. No oropharyngeal exudate.  Eyes: Conjunctivae and EOM are normal.  No scleral icterus.  Neck: Normal range of motion.  Cardiovascular: Normal rate and regular rhythm.  Pulmonary/Chest: Effort normal. No respiratory distress. He has no wheezes.  Abdominal: He exhibits no distension.  Musculoskeletal: He exhibits no edema or tenderness.  Neurological: He is alert and oriented to person, place, and time. He exhibits normal muscle tone. Coordination normal.  Skin: Skin is warm and dry. He is not diaphoretic. No erythema. No pallor.  Psychiatric: He has a normal mood and affect. His behavior is normal. Judgment and thought content normal.        Assessment & Plan:  HIV:   We can change to [facility].  You are up-to-date on vaccines and otherwise see him back in January it is time to renew his HMAP  CMV retinitis:  Sp treatment

## 2018-03-07 ENCOUNTER — Other Ambulatory Visit: Payer: Self-pay

## 2018-03-21 ENCOUNTER — Encounter: Payer: Self-pay | Admitting: Infectious Disease

## 2018-03-22 ENCOUNTER — Other Ambulatory Visit: Payer: Self-pay

## 2018-03-29 ENCOUNTER — Ambulatory Visit: Payer: Self-pay

## 2018-03-29 ENCOUNTER — Other Ambulatory Visit: Payer: Self-pay

## 2018-03-29 DIAGNOSIS — B20 Human immunodeficiency virus [HIV] disease: Secondary | ICD-10-CM

## 2018-03-29 LAB — T-HELPER CELL (CD4) - (RCID CLINIC ONLY)
CD4 T CELL ABS: 370 /uL — AB (ref 400–2700)
CD4 T CELL HELPER: 19 % — AB (ref 33–55)

## 2018-04-01 LAB — CBC WITH DIFFERENTIAL/PLATELET
Absolute Monocytes: 501 cells/uL (ref 200–950)
BASOS PCT: 0.7 %
Basophils Absolute: 32 cells/uL (ref 0–200)
EOS PCT: 5.5 %
Eosinophils Absolute: 253 cells/uL (ref 15–500)
HCT: 38.9 % (ref 38.5–50.0)
HEMOGLOBIN: 14 g/dL (ref 13.2–17.1)
Lymphs Abs: 1858 cells/uL (ref 850–3900)
MCH: 33.1 pg — ABNORMAL HIGH (ref 27.0–33.0)
MCHC: 36 g/dL (ref 32.0–36.0)
MCV: 92 fL (ref 80.0–100.0)
MONOS PCT: 10.9 %
MPV: 11.8 fL (ref 7.5–12.5)
NEUTROS ABS: 1955 {cells}/uL (ref 1500–7800)
Neutrophils Relative %: 42.5 %
Platelets: 278 10*3/uL (ref 140–400)
RBC: 4.23 10*6/uL (ref 4.20–5.80)
RDW: 12.7 % (ref 11.0–15.0)
Total Lymphocyte: 40.4 %
WBC: 4.6 10*3/uL (ref 3.8–10.8)

## 2018-04-01 LAB — COMPLETE METABOLIC PANEL WITH GFR
AG Ratio: 1.8 (calc) (ref 1.0–2.5)
ALT: 45 U/L (ref 9–46)
AST: 27 U/L (ref 10–40)
Albumin: 4.2 g/dL (ref 3.6–5.1)
Alkaline phosphatase (APISO): 78 U/L (ref 40–115)
BILIRUBIN TOTAL: 0.5 mg/dL (ref 0.2–1.2)
BUN: 13 mg/dL (ref 7–25)
CALCIUM: 9.1 mg/dL (ref 8.6–10.3)
CHLORIDE: 107 mmol/L (ref 98–110)
CO2: 24 mmol/L (ref 20–32)
Creat: 0.68 mg/dL (ref 0.60–1.35)
GFR, Est African American: 139 mL/min/{1.73_m2} (ref >=60)
GFR, Est Non African American: 120 mL/min/{1.73_m2} (ref >=60)
GLUCOSE: 95 mg/dL (ref 65–99)
Globulin: 2.4 g/dL (calc) (ref 1.9–3.7)
POTASSIUM: 4.4 mmol/L (ref 3.5–5.3)
Sodium: 139 mmol/L (ref 135–146)
Total Protein: 6.6 g/dL (ref 6.1–8.1)

## 2018-04-01 LAB — LIPID PANEL
CHOL/HDL RATIO: 3.9 (calc) (ref <5.0)
Cholesterol: 159 mg/dL (ref <200)
HDL: 41 mg/dL (ref >40)
LDL Cholesterol (Calc): 100 mg/dL (calc) — ABNORMAL HIGH
NON-HDL CHOLESTEROL (CALC): 118 mg/dL (ref <130)
Triglycerides: 89 mg/dL (ref <150)

## 2018-04-01 LAB — HIV-1 RNA QUANT-NO REFLEX-BLD
HIV 1 RNA Quant: <20 copies/mL
HIV-1 RNA Quant, Log: <1.3 Log copies/mL

## 2018-04-01 LAB — URINE CYTOLOGY ANCILLARY ONLY
Chlamydia: NEGATIVE
Neisseria Gonorrhea: NEGATIVE

## 2018-04-01 LAB — RPR: RPR Ser Ql: NONREACTIVE

## 2018-04-17 NOTE — Progress Notes (Signed)
Name: Curtis Burton  DOB: [birth year] MRN: [id number] PCP: Default, Provider, MD    Patient Active Problem List   Diagnosis Date Noted  . Neck nodule 03/18/2014  . Cough 07/29/2012  . Generalized anxiety disorder 05/27/2012  . Thrush 04/15/2012  . 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 Insomnia 03/27/2012  . HIV (human immunodeficiency virus infection) ([place]) 03/06/2012  . Anemia 03/06/2012     Subjective:  CC: HIV follow up care. He has no complaints or concerns today.   HPI: Curtis Burton is a 38 y.o. Hispanic male with HIV disease. Last OV in Sept-2019 with Dr. Tommy Medal. OI Hx: CMV retinitis, PCP pneumonia.   He is here today with his wife whom is also a patient here with HIV. He is taking Symtuza once a day with food as instructed. He reports no missed doses of his medications and they are very important to him. He has met with financial team and reapplied for HMAP. Labs two weeks ago with undetectable viral load, CD4 370. Lipid panel is unremarkable with LDL 100, total cholesterol 159 HDL 41 TG 89. Currently sexually active with his wife. No condoms used. No updates to his medical history to provide me today.  Review of Systems  Constitutional: Negative for chills, fever, malaise/fatigue and weight loss.  HENT: Negative for sore throat.        No dental problems  Respiratory: Negative for cough and sputum production.   Cardiovascular: Negative for chest pain and leg swelling.  Gastrointestinal: Negative for abdominal pain, diarrhea and vomiting.  Genitourinary: Negative for dysuria and flank pain.  Musculoskeletal: Negative for joint pain, myalgias and neck pain.  Skin: Negative for rash.  Neurological: Negative for dizziness, tingling and headaches.  Psychiatric/Behavioral: Negative for depression and substance abuse. The patient is not nervous/anxious and does not have insomnia.     Past Medical  History:  Diagnosis Date  . Anemia   . HIV infection ([place])   . Retinitis    PERIFERAL FOCAL  . UTI (urinary tract infection)     Outpatient Medications Prior to Visit  Medication Sig Dispense Refill  . Darunavir-Cobicisctat-Emtricitabine-Tenofovir Alafenamide (SYMTUZA) 800-150-200-10 MG TABS Take 1 tablet by mouth daily with breakfast. 30 tablet 11   No facility-administered medications prior to visit.      No Known Allergies  Social History   Tobacco Use  . Smoking status: Current Every Day Smoker    Packs/day: 0.25    Years: 15.00    Pack years: 3.75    Types: Cigarettes  . Smokeless tobacco: Never Used  . Tobacco comment: slowing down on ciggs  Substance Use Topics  . Alcohol use: Yes    Alcohol/week: 2.0 standard drinks    Types: 2 Shots of liquor per week    Comment: weekends  . Drug use: No    Social History   Substance and Sexual Activity  Sexual Activity Yes  . Partners: Female   Comment: pt declined condoms     Objective:   Vitals:   04/18/18 0917  BP: 118/66  Pulse: 83  Temp: 98 F (36.7 C)  Weight: 167 lb (75.8 kg)   Body mass index is 27.79 kg/m.  Physical Exam Constitutional:      Appearance: He is well-developed.     Comments: Seated comfortably in chair during visit.   HENT:     Mouth/Throat:     Dentition: Normal dentition. No dental abscesses.  Cardiovascular:     Rate and Rhythm: Normal rate and regular rhythm.     Heart sounds: Normal heart sounds.  Pulmonary:     Effort: Pulmonary effort is normal.     Breath sounds: Normal breath sounds.  Abdominal:     General: There is no distension.     Palpations: Abdomen is soft.     Tenderness: There is no abdominal tenderness.  Lymphadenopathy:     Cervical: No cervical adenopathy.  Skin:    General: Skin is warm and dry.     Findings: No rash.  Neurological:     Mental Status: He is alert and oriented to person, place, and time.  Psychiatric:        Judgment: Judgment  normal.     Comments: In good spirits today and engaged in care discussion.      Lab Results Lab Results  Component Value Date   WBC 4.6 03/29/2018   HGB 14.0 03/29/2018   HCT 38.9 03/29/2018   MCV 92.0 03/29/2018   PLT 278 03/29/2018    Lab Results  Component Value Date   CREATININE 0.68 03/29/2018   BUN 13 03/29/2018   NA 139 03/29/2018   K 4.4 03/29/2018   CL 107 03/29/2018   CO2 24 03/29/2018    Lab Results  Component Value Date   ALT 45 03/29/2018   AST 27 03/29/2018   ALKPHOS 87 08/15/2016   BILITOT 0.5 03/29/2018    Lab Results  Component Value Date   CHOL 159 03/29/2018   HDL 41 03/29/2018   LDLCALC 100 (H) 03/29/2018   TRIG 89 03/29/2018   CHOLHDL 3.9 03/29/2018   HIV 1 RNA Quant (copies/mL)  Date Value  03/29/2018 <20 NOT DETECTED  11/07/2017 <20 NOT DETECTED  03/07/2017 <20 NOT DETECTED   CD4 T Cell Abs (/uL)  Date Value  03/29/2018 370 (L)  03/07/2017 260 (L)  12/13/2016 290 (L)     Assessment & Plan:   Problem List Items Addressed This Visit      Unprioritized   HIV (human immunodeficiency virus infection) ([place])    Curtis Burton is doing exceptionally well with his Symtuza. Reviewed labs with him today and congratulated him on results; reassured that his medication is working perfectly for him. Using condoms for protection from pregnancy with his wife.   Will provide pneumovax booster today. May consider HPV vaccines for him in the future.        Other Visit Diagnoses    Human immunodeficiency virus (HIV) disease ([place])    -  Primary   Relevant Orders   HIV-1 RNA quant-no reflex-bld      Curtis Madeira, MSN, NP-C [facility] Pager: [phone] Office: [phone]  04/18/18  10:10 AM

## 2018-04-18 ENCOUNTER — Encounter: Payer: Self-pay | Admitting: Infectious Diseases

## 2018-04-18 ENCOUNTER — Ambulatory Visit (INDEPENDENT_AMBULATORY_CARE_PROVIDER_SITE_OTHER): Payer: Self-pay | Admitting: Infectious Diseases

## 2018-04-18 VITALS — BP 118/66 | HR 83 | Temp 98.0°F | Wt 167.0 lb

## 2018-04-18 DIAGNOSIS — Z23 Encounter for immunization: Secondary | ICD-10-CM

## 2018-04-18 DIAGNOSIS — B20 Human immunodeficiency virus [HIV] disease: Secondary | ICD-10-CM

## 2018-04-18 DIAGNOSIS — Z21 Asymptomatic human immunodeficiency virus [HIV] infection status: Secondary | ICD-10-CM

## 2018-04-18 NOTE — Assessment & Plan Note (Addendum)
Curtis Burton is doing exceptionally well with his Symtuza. Reviewed labs with him today and congratulated him on results; reassured that his medication is working perfectly for him. Using condoms for protection from pregnancy with his wife.   Will provide pneumovax booster today. May consider HPV vaccines for him in the future.

## 2018-04-18 NOTE — Patient Instructions (Signed)
Nice to meet you today!  Please continue to take your Symtuza every day as you are now with food.   Please come back in 3 months to see Dr. Daiva Eves again.

## 2018-04-18 NOTE — Addendum Note (Signed)
Addended by: Gerarda Fraction on: 04/18/2018 12:17 PM   Modules accepted: Orders

## 2018-07-17 ENCOUNTER — Other Ambulatory Visit: Payer: Self-pay

## 2018-07-17 DIAGNOSIS — B20 Human immunodeficiency virus [HIV] disease: Secondary | ICD-10-CM

## 2018-08-19 ENCOUNTER — Encounter: Payer: Self-pay | Admitting: Infectious Disease

## 2018-12-16 ENCOUNTER — Other Ambulatory Visit: Payer: Self-pay | Admitting: Infectious Disease

## 2018-12-16 ENCOUNTER — Other Ambulatory Visit: Payer: Self-pay

## 2018-12-16 MED ORDER — SYMTUZA 800-150-200-10 MG PO TABS: 1.0000 | ORAL_TABLET | Freq: Every day | ORAL | 3 refills | Status: DC

## 2018-12-17 ENCOUNTER — Ambulatory Visit: Payer: Self-pay

## 2018-12-17 ENCOUNTER — Other Ambulatory Visit: Payer: Self-pay

## 2019-01-01 ENCOUNTER — Encounter: Payer: Self-pay | Admitting: Infectious Disease

## 2019-01-07 ENCOUNTER — Other Ambulatory Visit: Payer: Self-pay | Admitting: *Deleted

## 2019-01-07 DIAGNOSIS — B20 Human immunodeficiency virus [HIV] disease: Secondary | ICD-10-CM

## 2019-01-08 ENCOUNTER — Other Ambulatory Visit: Payer: Self-pay

## 2019-01-08 ENCOUNTER — Ambulatory Visit: Payer: Self-pay

## 2019-01-08 ENCOUNTER — Encounter: Payer: Self-pay | Admitting: Infectious Disease

## 2019-01-08 DIAGNOSIS — B20 Human immunodeficiency virus [HIV] disease: Secondary | ICD-10-CM

## 2019-01-09 LAB — T-HELPER CELL (CD4) - (RCID CLINIC ONLY)
CD4 % Helper T Cell: 20 % — ABNORMAL LOW (ref 33–65)
CD4 T Cell Abs: 384 /uL — ABNORMAL LOW (ref 400–1790)

## 2019-01-16 LAB — HIV-1 RNA QUANT-NO REFLEX-BLD
HIV 1 RNA Quant: <20 copies/mL
HIV-1 RNA Quant, Log: <1.3 Log copies/mL

## 2019-01-22 ENCOUNTER — Encounter: Payer: Self-pay | Admitting: Infectious Disease

## 2019-01-28 ENCOUNTER — Encounter: Payer: Self-pay | Admitting: Infectious Disease

## 2019-01-28 ENCOUNTER — Ambulatory Visit (INDEPENDENT_AMBULATORY_CARE_PROVIDER_SITE_OTHER): Payer: Self-pay | Admitting: Infectious Disease

## 2019-01-28 ENCOUNTER — Other Ambulatory Visit: Payer: Self-pay

## 2019-01-28 VITALS — BP 136/86 | HR 101 | Temp 98.0°F | Ht 64.0 in | Wt 153.0 lb

## 2019-01-28 DIAGNOSIS — B20 Human immunodeficiency virus [HIV] disease: Secondary | ICD-10-CM

## 2019-01-28 MED ORDER — SYMTUZA 800-150-200-10 MG PO TABS: 1.0000 | ORAL_TABLET | Freq: Every day | ORAL | 11 refills | Status: DC

## 2019-01-28 NOTE — Progress Notes (Signed)
Subjective:   Chief complaint: followup for HIV on medications   Patient ID: Curtis Burton, male    DOB: 1980/12/06, 38 y.o.   MRN: [id number]  HPI 38 year-old Hispanic man withdiagnosed HIV AIDS CMV retinitis and  PCP pneumonia (latter dx made clinically).   He and [name] have renewed HMAP remains with a undetectable viral load.  Though it looks like they renewed relatively late.  I have concerns he may have missed some medications in the interim but he is undetectable.  We will have him come back in January to ensure that he has HMA P renewed for the next.   Past Medical History:  Diagnosis Date  . Anemia   . HIV infection ([facility])   . Retinitis    PERIFERAL FOCAL  . UTI (urinary tract infection)     Past Surgical History:  Procedure Laterality Date  . NO PAST SURGERIES      No family history on file.    Social History   Socioeconomic History  . Marital status: Single    Spouse name: Not on file  . Number of children: Not on file  . Years of education: Not on file  . Highest education level: Not on file  Occupational History  . Not on file  Social Needs  . Financial resource strain: Not on file  . Food insecurity    Worry: Not on file    Inability: Not on file  . Transportation needs    Medical: Not on file    Non-medical: Not on file  Tobacco Use  . Smoking status: Current Every Day Smoker    Packs/day: 0.25    Years: 15.00    Pack years: 3.75    Types: Cigarettes  . Smokeless tobacco: Never Used  . Tobacco comment: slowing down on ciggs  Substance and Sexual Activity  . Alcohol use: Yes    Alcohol/week: 2.0 standard drinks    Types: 2 Shots of liquor per week    Comment: weekends  . Drug use: No  . Sexual activity: Yes    Partners: Female    Comment: pt declined condoms  Lifestyle  . Physical activity    Days per week: Not on file    Minutes per session: Not on file  . Stress: Not on file  Relationships  . Social Musician  on phone: Not on file    Gets together: Not on file    Attends religious service: Not on file    Active member of club or organization: Not on file    Attends meetings of clubs or organizations: Not on file    Relationship status: Not on file  Other Topics Concern  . Not on file  Social History Narrative  . Not on file    No Known Allergies   Current Outpatient Medications:  .  Darunavir-Cobicisctat-Emtricitabine-Tenofovir Alafenamide (SYMTUZA) 800-150-200-10 MG TABS, Take 1 tablet by mouth daily with breakfast., Disp: 30 tablet, Rfl: 11   Review of Systems  Constitutional: Negative for activity change, appetite change, chills, diaphoresis, fatigue and unexpected weight change.  HENT: Negative for rhinorrhea, sinus pressure, sneezing and trouble swallowing.   Eyes: Negative for photophobia.  Respiratory: Negative for cough, chest tightness, shortness of breath and stridor.   Cardiovascular: Negative for palpitations and leg swelling.  Gastrointestinal: Negative for abdominal distention, anal bleeding, blood in stool and constipation.  Musculoskeletal: Negative for arthralgias, back pain, gait problem, joint swelling and myalgias.  Skin: Negative  for color change, pallor and wound.  Neurological: Negative for dizziness, tremors, weakness, light-headedness and numbness.  Hematological: Negative for adenopathy. Does not bruise/bleed easily.  Psychiatric/Behavioral: Negative for agitation, behavioral problems, confusion, decreased concentration, dysphoric mood and sleep disturbance. The patient is not nervous/anxious.        Objective:   Physical Exam  Constitutional: He is oriented to person, place, and time. He appears well-developed and well-nourished. No distress.  HENT:  Head: Normocephalic and atraumatic.  Mouth/Throat: Oropharynx is clear and moist. No oropharyngeal exudate.  Eyes: Conjunctivae and EOM are normal. No scleral icterus.  Neck: Normal range of motion.   Cardiovascular: Normal rate and regular rhythm.  Pulmonary/Chest: Effort normal. No respiratory distress. He has no wheezes.  Abdominal: He exhibits no distension.  Musculoskeletal:        General: No tenderness or edema.  Neurological: He is alert and oriented to person, place, and time. He exhibits normal muscle tone. Coordination normal.  Skin: Skin is warm and dry. He is not diaphoretic. No erythema. No pallor.  Psychiatric: He has a normal mood and affect. His behavior is normal. Judgment and thought content normal.        Assessment & Plan:  HIV:   Continue  SYMTUZA.  Flu vaccine. RTC in January to renew HMAP

## 2019-03-06 ENCOUNTER — Ambulatory Visit: Payer: Self-pay | Admitting: Infectious Disease

## 2019-03-17 ENCOUNTER — Encounter: Payer: Self-pay | Admitting: Infectious Disease

## 2019-03-17 ENCOUNTER — Ambulatory Visit: Payer: Self-pay

## 2019-03-17 ENCOUNTER — Other Ambulatory Visit: Payer: Self-pay

## 2019-04-28 ENCOUNTER — Ambulatory Visit (INDEPENDENT_AMBULATORY_CARE_PROVIDER_SITE_OTHER): Payer: Self-pay | Admitting: Infectious Disease

## 2019-04-28 ENCOUNTER — Encounter: Payer: Self-pay | Admitting: Infectious Disease

## 2019-04-28 ENCOUNTER — Other Ambulatory Visit: Payer: Self-pay

## 2019-04-28 VITALS — BP 133/78 | HR 94 | Temp 98.1°F | Wt 157.0 lb

## 2019-04-28 DIAGNOSIS — Z113 Encounter for screening for infections with a predominantly sexual mode of transmission: Secondary | ICD-10-CM

## 2019-04-28 DIAGNOSIS — B20 Human immunodeficiency virus [HIV] disease: Secondary | ICD-10-CM

## 2019-04-28 NOTE — Progress Notes (Signed)
Subjective:   Chief complaint: followup for HIV on medications   Patient ID: Curtis Burton, male    DOB: Aug 29, 1980, 39 y.o.   MRN: [id number]  HPI 39 year-old Hispanic man withdiagnosed HIV AIDS CMV retinitis and  PCP pneumonia (latter dx made clinically).   He and [name] have renewed HMAP remains with a undetectable viral load.  Though it looks like they renewed relatively late.  I have concerns he may have missed some medications in the interim but he is undetectable.  Renewed his HMA P program.  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   Past Medical History:  Diagnosis Date  . Anemia   . HIV infection ([place])   . Retinitis    PERIFERAL FOCAL  . UTI (urinary tract infection)     Past Surgical History:  Procedure Laterality Date  . NO PAST SURGERIES      No family history on file.    Social History   Socioeconomic History  . Marital status: Single    Spouse name: Not on file  . Number of children: Not on file  . Years of education: Not on file  . Highest education level: Not on file  Occupational History  . Not on file  Tobacco Use  . Smoking status: Current Every Day Smoker    Packs/day: 0.25    Years: 15.00    Pack years: 3.75    Types: Cigarettes  . Smokeless tobacco: Never Used  . Tobacco comment: slowing down on ciggs  Substance and Sexual Activity  . Alcohol use: Yes    Alcohol/week: 2.0 standard drinks    Types: 2 Shots of liquor per week    Comment: weekends  . Drug use: No  . Sexual activity: Yes    Partners: Female    Comment: pt declined condoms  Other Topics Concern  . Not on file  Social History Narrative  . Not on file   Social Determinants of Health   Financial Resource Strain:   . Difficulty of Paying Living Expenses: Not on file  Food Insecurity:   . Worried About Charity fundraiser in the Last Year: Not on file  . Ran Out of Food in the Last Year: Not on file  Transportation Needs:   .  Lack of Transportation (Medical): Not on file  . Lack of Transportation (Non-Medical): Not on file  Physical Activity:   . Days of Exercise per Week: Not on file  . Minutes of Exercise per Session: Not on file  Stress:   . Feeling of Stress : Not on file  Social Connections:   . Frequency of Communication with Friends and Family: Not on file  . Frequency of Social Gatherings with Friends and Family: Not on file  . Attends Religious Services: Not on file  . Active Member of Clubs or Organizations: Not on file  . Attends Archivist Meetings: Not on file  . Marital Status: Not on file    No Known Allergies   Current Outpatient Medications:  .  Darunavir-Cobicisctat-Emtricitabine-Tenofovir Alafenamide (SYMTUZA) 800-150-200-10 MG TABS, Take 1 tablet by mouth daily with breakfast., Disp: 30 tablet, Rfl: 11   Review of Systems  Constitutional: Negative for activity change, appetite change, chills, diaphoresis, fatigue and unexpected weight change.  HENT: Negative for rhinorrhea, sinus pressure, sneezing and trouble swallowing.   Eyes: Negative for photophobia.  Respiratory: Negative for cough, chest tightness, shortness of breath and stridor.   Cardiovascular: Negative  for palpitations and leg swelling.  Gastrointestinal: Negative for abdominal distention, anal bleeding, blood in stool and constipation.  Musculoskeletal: Negative for arthralgias, back pain, gait problem, joint swelling and myalgias.  Skin: Negative for color change, pallor and wound.  Neurological: Negative for dizziness, tremors, syncope, weakness, light-headedness and numbness.  Hematological: Negative for adenopathy. Does not bruise/bleed easily.  Psychiatric/Behavioral: Negative for agitation, behavioral problems, confusion, decreased concentration, dysphoric mood, sleep disturbance and suicidal ideas. The patient is not nervous/anxious.        Objective:   Physical Exam  Constitutional: He is oriented  to person, place, and time. He appears well-developed and well-nourished. No distress.  HENT:  Head: Normocephalic and atraumatic.  Mouth/Throat: Oropharynx is clear and moist. No oropharyngeal exudate.  Eyes: Conjunctivae and EOM are normal. No scleral icterus.  Cardiovascular: Normal rate and regular rhythm.  Pulmonary/Chest: Effort normal. No respiratory distress. He has no wheezes.  Abdominal: He exhibits no distension.  Musculoskeletal:        General: No tenderness or edema.     Cervical back: Normal range of motion.  Neurological: He is alert and oriented to person, place, and time. He exhibits normal muscle tone. Coordination normal.  Skin: Skin is warm and dry. He is not diaphoretic. No erythema. No pallor.  Psychiatric: He has a normal mood and affect. His behavior is normal. Judgment and thought content normal.        Assessment & Plan:  HIV:   Continue  SYMTUZA.  Flu vaccine. RTC in January to renew HMAP

## 2019-04-29 LAB — T-HELPER CELL (CD4) - (RCID CLINIC ONLY)
CD4 % Helper T Cell: 20 % — ABNORMAL LOW (ref 33–65)
CD4 T Cell Abs: 436 /uL (ref 400–1790)

## 2019-05-01 LAB — HIV-1 RNA QUANT-NO REFLEX-BLD
HIV 1 RNA Quant: <20 copies/mL — AB
HIV-1 RNA Quant, Log: <1.3 Log copies/mL — AB

## 2019-10-15 ENCOUNTER — Other Ambulatory Visit: Payer: Self-pay

## 2019-10-29 ENCOUNTER — Other Ambulatory Visit: Payer: Self-pay

## 2019-10-29 ENCOUNTER — Encounter: Payer: Self-pay | Admitting: Infectious Disease

## 2019-10-29 ENCOUNTER — Ambulatory Visit: Payer: Self-pay

## 2019-11-13 ENCOUNTER — Encounter: Payer: Self-pay | Admitting: Infectious Disease

## 2019-12-25 ENCOUNTER — Ambulatory Visit: Payer: Self-pay | Admitting: Infectious Disease

## 2020-01-01 ENCOUNTER — Ambulatory Visit: Payer: Self-pay | Admitting: Infectious Disease

## 2020-02-18 ENCOUNTER — Other Ambulatory Visit: Payer: Self-pay | Admitting: Infectious Disease

## 2020-03-01 ENCOUNTER — Ambulatory Visit: Payer: Self-pay | Admitting: Infectious Disease

## 2020-03-25 ENCOUNTER — Other Ambulatory Visit: Payer: Self-pay | Admitting: Infectious Disease

## 2020-03-31 ENCOUNTER — Ambulatory Visit: Payer: Self-pay

## 2020-03-31 ENCOUNTER — Ambulatory Visit: Payer: Self-pay | Admitting: Infectious Disease

## 2020-03-31 ENCOUNTER — Encounter: Payer: Self-pay | Admitting: Infectious Disease

## 2020-03-31 ENCOUNTER — Other Ambulatory Visit: Payer: Self-pay

## 2020-03-31 VITALS — BP 119/77 | HR 78 | Temp 97.8°F | Resp 16 | Ht 64.0 in | Wt 160.8 lb

## 2020-03-31 DIAGNOSIS — B259 Cytomegaloviral disease, unspecified: Secondary | ICD-10-CM

## 2020-03-31 DIAGNOSIS — Z7185 Encounter for immunization safety counseling: Secondary | ICD-10-CM

## 2020-03-31 DIAGNOSIS — H3090 Unspecified chorioretinal inflammation, unspecified eye: Secondary | ICD-10-CM

## 2020-03-31 DIAGNOSIS — B20 Human immunodeficiency virus [HIV] disease: Secondary | ICD-10-CM

## 2020-03-31 NOTE — Addendum Note (Signed)
Addended by: Harley Alto on: 03/31/2020 11:01 AM   Modules accepted: Orders

## 2020-03-31 NOTE — Progress Notes (Signed)
Subjective:   Chief complaint: followup for HIV on medications   Patient ID: Curtis Burton, male    DOB: 11/23/1980, 40 y.o.   MRN: [id number]  HPI 40 year-old Hispanic man withdiagnosed HIV AIDS CMV retinitis and  PCP pneumonia (latter dx made clinically).   He and [name] have renewed HMAP   He and his wife had had to [place] vaccines and need a third one.  Both renewed their HIV medication program and early get labs today I am having both of them have labs with her visits as it cuts down on the need for traveling.      Past Medical History:  Diagnosis Date  . Anemia   . HIV infection ([facility])   . Retinitis    PERIFERAL FOCAL  . UTI (urinary tract infection)     Past Surgical History:  Procedure Laterality Date  . NO PAST SURGERIES      No family history on file.    Social History   Socioeconomic History  . Marital status: Single    Spouse name: Not on file  . Number of children: Not on file  . Years of education: Not on file  . Highest education level: Not on file  Occupational History  . Not on file  Tobacco Use  . Smoking status: Current Every Day Smoker    Packs/day: 0.25    Years: 15.00    Pack years: 3.75    Types: Cigarettes  . Smokeless tobacco: Never Used  . Tobacco comment: slowing down on ciggs  Substance and Sexual Activity  . Alcohol use: Yes    Alcohol/week: 2.0 standard drinks    Types: 2 Shots of liquor per week    Comment: weekends  . Drug use: No  . Sexual activity: Yes    Partners: Female    Comment: pt declined condoms  Other Topics Concern  . Not on file  Social History Narrative  . Not on file   Social Determinants of Health   Financial Resource Strain: Not on file  Food Insecurity: Not on file  Transportation Needs: Not on file  Physical Activity: Not on file  Stress: Not on file  Social Connections: Not on file    No Known Allergies   Current Outpatient Medications:  .  SYMTUZA 800-150-200-10 MG TABS, TAKE  1 TABLET BY MOUTH DAILY WITH BREAKFAST, Disp: 30 tablet, Rfl: 0   Review of Systems  Constitutional: Negative for activity change, appetite change, chills, diaphoresis, fatigue and unexpected weight change.  HENT: Negative for rhinorrhea, sinus pressure, sneezing and trouble swallowing.   Eyes: Negative for photophobia.  Respiratory: Negative for cough, chest tightness, shortness of breath and stridor.   Cardiovascular: Negative for palpitations and leg swelling.  Gastrointestinal: Negative for abdominal distention, anal bleeding, blood in stool and constipation.  Musculoskeletal: Negative for arthralgias, back pain, gait problem, joint swelling and myalgias.  Skin: Negative for color change, pallor and wound.  Neurological: Negative for dizziness, tremors, syncope, weakness, light-headedness and numbness.  Hematological: Negative for adenopathy. Does not bruise/bleed easily.  Psychiatric/Behavioral: Negative for agitation, behavioral problems, confusion, decreased concentration, dysphoric mood, sleep disturbance and suicidal ideas. The patient is not nervous/anxious.        Objective:   Physical Exam Constitutional:      General: He is not in acute distress.    Appearance: He is well-developed. He is not diaphoretic.  HENT:     Head: Normocephalic and atraumatic.     Mouth/Throat:  Pharynx: No oropharyngeal exudate.  Eyes:     General: No scleral icterus.    Conjunctiva/sclera: Conjunctivae normal.  Cardiovascular:     Rate and Rhythm: Normal rate and regular rhythm.  Pulmonary:     Effort: Pulmonary effort is normal. No respiratory distress.     Breath sounds: No wheezing.  Abdominal:     General: There is no distension.  Musculoskeletal:        General: No tenderness.     Cervical back: Normal range of motion.  Skin:    General: Skin is warm and dry.     Coloration: Skin is not pale.     Findings: No erythema.  Neurological:     General: No focal deficit present.      Mental Status: He is alert and oriented to person, place, and time.     Motor: No abnormal muscle tone.     Coordination: Coordination normal.  Psychiatric:        Mood and Affect: Mood normal.        Behavior: Behavior normal.        Thought Content: Thought content normal.        Judgment: Judgment normal.         Assessment & Plan:   HIV disease he has renewed HMA P continue SYMTUZA check labs today return to clinic in July.  COVID prevention needs third dose if he cannot get a Moderna get [employer], if can get former get full dose

## 2020-04-01 LAB — T-HELPER CELL (CD4) - (RCID CLINIC ONLY)
CD4 % Helper T Cell: 24 % — ABNORMAL LOW (ref 33–65)
CD4 T Cell Abs: 465 /uL (ref 400–1790)

## 2020-04-02 LAB — CBC WITH DIFFERENTIAL/PLATELET
Absolute Monocytes: 458 cells/uL (ref 200–950)
Basophils Absolute: 29 cells/uL (ref 0–200)
Basophils Relative: 0.5 %
Eosinophils Absolute: 342 cells/uL (ref 15–500)
Eosinophils Relative: 5.9 %
HCT: 40.9 % (ref 38.5–50.0)
Hemoglobin: 14.6 g/dL (ref 13.2–17.1)
Lymphs Abs: 1914 cells/uL (ref 850–3900)
MCH: 34 pg — ABNORMAL HIGH (ref 27.0–33.0)
MCHC: 35.7 g/dL (ref 32.0–36.0)
MCV: 95.3 fL (ref 80.0–100.0)
MPV: 10.8 fL (ref 7.5–12.5)
Monocytes Relative: 7.9 %
Neutro Abs: 3057 cells/uL (ref 1500–7800)
Neutrophils Relative %: 52.7 %
Platelets: 337 10*3/uL (ref 140–400)
RBC: 4.29 10*6/uL (ref 4.20–5.80)
RDW: 12.3 % (ref 11.0–15.0)
Total Lymphocyte: 33 %
WBC: 5.8 10*3/uL (ref 3.8–10.8)

## 2020-04-02 LAB — COMPLETE METABOLIC PANEL WITH GFR
AG Ratio: 1.7 (calc) (ref 1.0–2.5)
ALT: 19 U/L (ref 9–46)
AST: 18 U/L (ref 10–40)
Albumin: 4.3 g/dL (ref 3.6–5.1)
Alkaline phosphatase (APISO): 90 U/L (ref 36–130)
BUN: 12 mg/dL (ref 7–25)
CO2: 27 mmol/L (ref 20–32)
Calcium: 9.4 mg/dL (ref 8.6–10.3)
Chloride: 103 mmol/L (ref 98–110)
Creat: 0.63 mg/dL (ref 0.60–1.35)
GFR, Est African American: 142 mL/min/{1.73_m2} (ref >=60)
GFR, Est Non African American: 122 mL/min/{1.73_m2} (ref >=60)
Globulin: 2.5 g/dL (calc) (ref 1.9–3.7)
Glucose, Bld: 82 mg/dL (ref 65–99)
Potassium: 4.3 mmol/L (ref 3.5–5.3)
Sodium: 137 mmol/L (ref 135–146)
Total Bilirubin: 0.4 mg/dL (ref 0.2–1.2)
Total Protein: 6.8 g/dL (ref 6.1–8.1)

## 2020-04-02 LAB — LIPID PANEL
Cholesterol: 178 mg/dL (ref <200)
HDL: 46 mg/dL (ref >=40)
LDL Cholesterol (Calc): 105 mg/dL (calc) — ABNORMAL HIGH
Non-HDL Cholesterol (Calc): 132 mg/dL (calc) — ABNORMAL HIGH (ref <130)
Total CHOL/HDL Ratio: 3.9 (calc) (ref <5.0)
Triglycerides: 155 mg/dL — ABNORMAL HIGH (ref <150)

## 2020-04-02 LAB — HIV-1 RNA QUANT-NO REFLEX-BLD
HIV 1 RNA Quant: <20 Copies/mL
HIV-1 RNA Quant, Log: <1.3 Log cps/mL

## 2020-04-02 LAB — HEPATITIS B SURFACE ANTIBODY, QUANTITATIVE: Hep B S AB Quant (Post): <5 m[IU]/mL — ABNORMAL LOW (ref >=10)

## 2020-04-02 LAB — RPR: RPR Ser Ql: NONREACTIVE

## 2020-04-22 ENCOUNTER — Other Ambulatory Visit: Payer: Self-pay | Admitting: Infectious Disease

## 2020-09-28 ENCOUNTER — Ambulatory Visit: Payer: Self-pay

## 2020-09-28 ENCOUNTER — Ambulatory Visit (INDEPENDENT_AMBULATORY_CARE_PROVIDER_SITE_OTHER): Payer: Self-pay | Admitting: Infectious Disease

## 2020-09-28 ENCOUNTER — Other Ambulatory Visit: Payer: Self-pay

## 2020-09-28 ENCOUNTER — Encounter: Payer: Self-pay | Admitting: Infectious Disease

## 2020-09-28 VITALS — BP 126/76 | HR 83 | Temp 97.8°F | Wt 158.0 lb

## 2020-09-28 DIAGNOSIS — B20 Human immunodeficiency virus [HIV] disease: Secondary | ICD-10-CM

## 2020-09-28 DIAGNOSIS — F411 Generalized anxiety disorder: Secondary | ICD-10-CM

## 2020-09-28 DIAGNOSIS — H3090 Unspecified chorioretinal inflammation, unspecified eye: Secondary | ICD-10-CM

## 2020-09-28 DIAGNOSIS — B259 Cytomegaloviral disease, unspecified: Secondary | ICD-10-CM

## 2020-09-28 MED ORDER — SYMTUZA 800-150-200-10 MG PO TABS: 1.0000 | ORAL_TABLET | Freq: Every day | ORAL | 11 refills | Status: DC

## 2020-09-28 NOTE — Progress Notes (Signed)
Subjective:   Chief complaint follow-up for HIV on medications   Patient ID: Curtis Burton, male    DOB: December 26, 1980, 40 y.o.   MRN: [id number]  HPI 40 year-old Hispanic man withdiagnosed HIV AIDS CMV retinitis and  PCP pneumonia (latter dx made clinically).   He is in good spirits today.  Only had 1 mRNA vaccine apparently I counseled him to get another 1 when the updated vaccine comes out in September.        Past Medical History:  Diagnosis Date   Anemia    HIV infection (HCC)    Retinitis    PERIFERAL FOCAL   UTI (urinary tract infection)     Past Surgical History:  Procedure Laterality Date   NO PAST SURGERIES      No family history on file.    Social History   Socioeconomic History   Marital status: Single    Spouse name: Not on file   Number of children: Not on file   Years of education: Not on file   Highest education level: Not on file  Occupational History   Not on file  Tobacco Use   Smoking status: Every Day    Packs/day: 0.25    Years: 15.00    Pack years: 3.75    Types: Cigarettes   Smokeless tobacco: Never   Tobacco comments:    slowing down on ciggs  Substance and Sexual Activity   Alcohol use: Yes    Alcohol/week: 2.0 standard drinks    Types: 2 Shots of liquor per week    Comment: weekends   Drug use: No   Sexual activity: Yes    Partners: Female    Comment: pt declined condoms  Other Topics Concern   Not on file  Social History Narrative   Not on file   Social Determinants of Health   Financial Resource Strain: Not on file  Food Insecurity: Not on file  Transportation Needs: Not on file  Physical Activity: Not on file  Stress: Not on file  Social Connections: Not on file    No Known Allergies   Current Outpatient Medications:    Darunavir-Cobicisctat-Emtricitabine-Tenofovir Alafenamide (SYMTUZA) 800-150-200-10 MG TABS, Take 1 tablet by mouth daily with breakfast., Disp: 30 tablet, Rfl: 11   Review of  Systems  Constitutional:  Negative for chills, diaphoresis and fever.  HENT:  Negative for congestion, hearing loss, sore throat and tinnitus.   Respiratory:  Negative for cough, shortness of breath and wheezing.   Cardiovascular:  Negative for chest pain, palpitations and leg swelling.  Gastrointestinal:  Negative for abdominal pain, blood in stool, constipation, diarrhea, nausea and vomiting.  Genitourinary:  Negative for dysuria, flank pain and hematuria.  Musculoskeletal:  Negative for arthralgias, back pain and myalgias.  Skin:  Negative for rash.  Neurological:  Negative for dizziness, seizures, speech difficulty, weakness, light-headedness, numbness and headaches.  Hematological:  Does not bruise/bleed easily.  Psychiatric/Behavioral:  Negative for agitation, behavioral problems, confusion, decreased concentration, dysphoric mood, hallucinations and suicidal ideas. The patient is not nervous/anxious.       Objective:   Physical Exam Constitutional:      Appearance: He is well-developed.  HENT:     Head: Normocephalic and atraumatic.  Eyes:     Extraocular Movements: Extraocular movements intact.     Conjunctiva/sclera: Conjunctivae normal.  Cardiovascular:     Rate and Rhythm: Normal rate and regular rhythm.  Pulmonary:     Effort: Pulmonary effort is normal. No respiratory  distress.     Breath sounds: No wheezing.  Abdominal:     General: There is no distension.     Palpations: Abdomen is soft.  Musculoskeletal:        General: No tenderness. Normal range of motion.     Cervical back: Normal range of motion and neck supple.  Skin:    General: Skin is warm and dry.     Coloration: Skin is not jaundiced or pale.     Findings: No bruising, erythema, lesion or rash.  Neurological:     General: No focal deficit present.     Mental Status: He is alert and oriented to person, place, and time.  Psychiatric:        Mood and Affect: Mood normal.        Behavior: Behavior  normal.        Thought Content: Thought content normal.        Judgment: Judgment normal.        Assessment & Plan:   HIV disease: continue Symtuza. I have reviewed his VL from February 2022 which was <20 and CD4 count which was greater than 400 I have prescribed SYMTUZA again and sent in the prescription  I am ordering a repeat viral load and CD4 count today along with CMP CBC with differential RPR GC and chlamydia and lipid panel  Hx of CMV retinitis: resolved   Anxiety: chronic and stable without meds

## 2020-09-29 LAB — URINE CYTOLOGY ANCILLARY ONLY
Chlamydia: NEGATIVE
Comment: NEGATIVE
Comment: NORMAL
Neisseria Gonorrhea: NEGATIVE

## 2020-09-30 LAB — CBC WITH DIFFERENTIAL/PLATELET
Absolute Monocytes: 501 cells/uL (ref 200–950)
Basophils Absolute: 39 cells/uL (ref 0–200)
Basophils Relative: 0.5 %
Eosinophils Absolute: 316 cells/uL (ref 15–500)
Eosinophils Relative: 4.1 %
HCT: 42.8 % (ref 38.5–50.0)
Hemoglobin: 14.3 g/dL (ref 13.2–17.1)
Lymphs Abs: 1925 cells/uL (ref 850–3900)
MCH: 32.8 pg (ref 27.0–33.0)
MCHC: 33.4 g/dL (ref 32.0–36.0)
MCV: 98.2 fL (ref 80.0–100.0)
MPV: 10.9 fL (ref 7.5–12.5)
Monocytes Relative: 6.5 %
Neutro Abs: 4920 cells/uL (ref 1500–7800)
Neutrophils Relative %: 63.9 %
Platelets: 276 10*3/uL (ref 140–400)
RBC: 4.36 10*6/uL (ref 4.20–5.80)
RDW: 13 % (ref 11.0–15.0)
Total Lymphocyte: 25 %
WBC: 7.7 10*3/uL (ref 3.8–10.8)

## 2020-09-30 LAB — COMPLETE METABOLIC PANEL WITH GFR
AG Ratio: 2 (calc) (ref 1.0–2.5)
ALT: 19 U/L (ref 9–46)
AST: 17 U/L (ref 10–40)
Albumin: 4.5 g/dL (ref 3.6–5.1)
Alkaline phosphatase (APISO): 77 U/L (ref 36–130)
BUN: 13 mg/dL (ref 7–25)
CO2: 25 mmol/L (ref 20–32)
Calcium: 9.3 mg/dL (ref 8.6–10.3)
Chloride: 105 mmol/L (ref 98–110)
Creat: 0.61 mg/dL (ref 0.60–1.29)
Globulin: 2.2 g/dL (calc) (ref 1.9–3.7)
Glucose, Bld: 81 mg/dL (ref 65–99)
Potassium: 4.5 mmol/L (ref 3.5–5.3)
Sodium: 137 mmol/L (ref 135–146)
Total Bilirubin: 0.6 mg/dL (ref 0.2–1.2)
Total Protein: 6.7 g/dL (ref 6.1–8.1)
eGFR: 123 mL/min/{1.73_m2} (ref >=60)

## 2020-09-30 LAB — LIPID PANEL
Cholesterol: 197 mg/dL (ref <200)
HDL: 57 mg/dL (ref >=40)
LDL Cholesterol (Calc): 115 mg/dL (calc) — ABNORMAL HIGH
Non-HDL Cholesterol (Calc): 140 mg/dL (calc) — ABNORMAL HIGH (ref <130)
Total CHOL/HDL Ratio: 3.5 (calc) (ref <5.0)
Triglycerides: 134 mg/dL (ref <150)

## 2020-09-30 LAB — HIV-1 RNA QUANT-NO REFLEX-BLD
HIV 1 RNA Quant: NOT DETECTED Copies/mL
HIV-1 RNA Quant, Log: NOT DETECTED Log cps/mL

## 2020-09-30 LAB — RPR: RPR Ser Ql: NONREACTIVE

## 2021-04-05 NOTE — Progress Notes (Signed)
Subjective:   Chief complaint : Follow-up for HIV disease but not on medications having not renewed hMAP and not had meds since September 2022   Patient ID: Curtis Burton, male    DOB: 05-04-1980, 41 y.o.   MRN: [id number]  HPI 41 year-old Hispanic man withdiagnosed HIV AIDS CMV retinitis and  PCP pneumonia (latter dx made clinically).   Has had 1 COVID-vaccine which made him very sick though it sounds fairly typical for second dose vaccines or first dose for those who have had COVID infection and he does not want another vaccine today for COVID but he is agreeable to flu shot and Prevnar 20.  He has not been on the [facility] for several months due to failure to renew his H MAP program.  He is still smoking cigarettes      Past Medical History:  Diagnosis Date   Anemia    HIV infection ([place])    Retinitis    PERIFERAL FOCAL   UTI (urinary tract infection)     Past Surgical History:  Procedure Laterality Date   NO PAST SURGERIES      No family history on file.    Social History   Socioeconomic History   Marital status: Single    Spouse name: Not on file   Number of children: Not on file   Years of education: Not on file   Highest education level: Not on file  Occupational History   Not on file  Tobacco Use   Smoking status: Every Day    Packs/day: 0.25    Years: 15.00    Pack years: 3.75    Types: Cigarettes   Smokeless tobacco: Never   Tobacco comments:    slowing down on ciggs  Substance and Sexual Activity   Alcohol use: Yes    Alcohol/week: 2.0 standard drinks    Types: 2 Shots of liquor per week    Comment: weekends   Drug use: No   Sexual activity: Yes    Partners: Female    Comment: pt declined condoms  Other Topics Concern   Not on file  Social History Narrative   Not on file   Social Determinants of Health   Financial Resource Strain: Not on file  Food Insecurity: Not on file  Transportation Needs: Not on file  Physical  Activity: Not on file  Stress: Not on file  Social Connections: Not on file    No Known Allergies   Current Outpatient Medications:    Darunavir-Cobicisctat-Emtricitabine-Tenofovir Alafenamide (SYMTUZA) 800-150-200-10 MG TABS, Take 1 tablet by mouth daily with breakfast., Disp: 30 tablet, Rfl: 11   Review of Systems  Constitutional:  Negative for activity change, appetite change, chills, diaphoresis, fatigue, fever and unexpected weight change.  HENT:  Negative for congestion, rhinorrhea, sinus pressure, sneezing, sore throat and trouble swallowing.   Eyes:  Negative for photophobia and visual disturbance.  Respiratory:  Negative for cough, chest tightness, shortness of breath, wheezing and stridor.   Cardiovascular:  Negative for chest pain, palpitations and leg swelling.  Gastrointestinal:  Negative for abdominal distention, abdominal pain, anal bleeding, blood in stool, constipation, diarrhea, nausea and vomiting.  Genitourinary:  Negative for difficulty urinating, dysuria, flank pain and hematuria.  Musculoskeletal:  Negative for arthralgias, back pain, gait problem, joint swelling and myalgias.  Skin:  Negative for color change, pallor, rash and wound.  Neurological:  Negative for dizziness, tremors, weakness and light-headedness.  Hematological:  Negative for adenopathy. Does not bruise/bleed easily.  Psychiatric/Behavioral:  Negative for agitation, behavioral problems, confusion, decreased concentration, dysphoric mood and sleep disturbance.       Objective:   Physical Exam Constitutional:      Appearance: He is well-developed.  HENT:     Head: Normocephalic and atraumatic.  Eyes:     Conjunctiva/sclera: Conjunctivae normal.  Cardiovascular:     Rate and Rhythm: Normal rate and regular rhythm.  Pulmonary:     Effort: Pulmonary effort is normal. No respiratory distress.     Breath sounds: No wheezing.  Abdominal:     General: There is no distension.     Palpations:  Abdomen is soft.  Musculoskeletal:        General: No tenderness. Normal range of motion.     Cervical back: Normal range of motion and neck supple.  Skin:    General: Skin is warm and dry.     Coloration: Skin is not pale.     Findings: No erythema or rash.  Neurological:     General: No focal deficit present.     Mental Status: He is alert and oriented to person, place, and time.  Psychiatric:        Mood and Affect: Mood normal.        Behavior: Behavior normal.        Thought Content: Thought content normal.        Judgment: Judgment normal.        Assessment & Plan:  HIV disease: I will check viral load CD4 CMP and CBC with differential.  Unfortunate is been off meds for nearly for 5 months so his labs will not look good.  Hopefully his CD4 count is not dropped.  I am sending in a prescription for [facility] and he is renewing his H MAP but he will also need a bottle of SYMTUZA pills because HMA P will take time to process and become active.  Need to follow him and his girlfriend more closely because this is not the first time that they failed to renew H MAP.  Smoking counseled extensively regarding smoking cessation I have sent in prescriptions for Chantix to the [facility] on: [address].  Seen counseling recommended updated COVID-vaccine as well as flu shot and Prevnar 20 and he agreed to the latter 2.

## 2021-04-06 ENCOUNTER — Ambulatory Visit: Payer: Self-pay

## 2021-04-06 ENCOUNTER — Other Ambulatory Visit: Payer: Self-pay

## 2021-04-06 ENCOUNTER — Encounter: Payer: Self-pay | Admitting: Infectious Disease

## 2021-04-06 ENCOUNTER — Ambulatory Visit (INDEPENDENT_AMBULATORY_CARE_PROVIDER_SITE_OTHER): Payer: Self-pay | Admitting: Infectious Disease

## 2021-04-06 VITALS — BP 124/77 | HR 92 | Temp 97.7°F | Wt 163.0 lb

## 2021-04-06 DIAGNOSIS — B20 Human immunodeficiency virus [HIV] disease: Secondary | ICD-10-CM

## 2021-04-06 DIAGNOSIS — F17200 Nicotine dependence, unspecified, uncomplicated: Secondary | ICD-10-CM

## 2021-04-06 DIAGNOSIS — Z23 Encounter for immunization: Secondary | ICD-10-CM

## 2021-04-06 DIAGNOSIS — Z7185 Encounter for immunization safety counseling: Secondary | ICD-10-CM

## 2021-04-06 HISTORY — DX: Encounter for immunization safety counseling: Z71.85

## 2021-04-06 HISTORY — DX: Nicotine dependence, unspecified, uncomplicated: F17.200

## 2021-04-06 MED ORDER — CHANTIX STARTING MONTH PAK 0.5 MG X 11 & 1 MG X 42 PO TBPK: ORAL_TABLET | ORAL | 0 refills | Status: AC

## 2021-04-06 MED ORDER — SYMTUZA 800-150-200-10 MG PO TABS: 1.0000 | ORAL_TABLET | Freq: Every day | ORAL | 11 refills | Status: DC

## 2021-04-06 MED ORDER — VARENICLINE TARTRATE 1 MG PO TABS: 1.0000 mg | ORAL_TABLET | Freq: Two times a day (BID) | ORAL | 1 refills | Status: DC

## 2021-04-06 NOTE — Addendum Note (Signed)
Addended by: Tressa Busman T on: 04/06/2021 11:29 AM   Modules accepted: Orders

## 2021-04-08 ENCOUNTER — Other Ambulatory Visit: Payer: Self-pay | Admitting: Pharmacist

## 2021-04-08 DIAGNOSIS — B20 Human immunodeficiency virus [HIV] disease: Secondary | ICD-10-CM

## 2021-04-08 MED ORDER — SYMTUZA 800-150-200-10 MG PO TABS: 1.0000 | ORAL_TABLET | Freq: Every day | ORAL | 0 refills | Status: DC

## 2021-04-08 NOTE — Progress Notes (Signed)
Medication Samples have been provided to the patient. ° °Drug name: Symtuza        °Strength: 800/150/200/10 mg °Qty: 30  Tablets (1 bottles) °LOT: [id number]   °Exp.Date: 6/24 ° °Dosing instructions: Take one tablet by mouth once daily with food ° °The patient has been instructed regarding the correct time, dose, and frequency of taking this medication, including desired effects and most common side effects.  ° °Agustina Witzke, PharmD, CPP °Clinical Pharmacist Practitioner °Infectious Diseases Clinical Pharmacist °[facility] ° °

## 2021-04-09 LAB — LIPID PANEL
Cholesterol: 147 mg/dL (ref <200)
HDL: 36 mg/dL — ABNORMAL LOW (ref >=40)
LDL Cholesterol (Calc): 89 mg/dL (calc)
Non-HDL Cholesterol (Calc): 111 mg/dL (calc) (ref <130)
Total CHOL/HDL Ratio: 4.1 (calc) (ref <5.0)
Triglycerides: 128 mg/dL (ref <150)

## 2021-04-09 LAB — COMPLETE METABOLIC PANEL WITH GFR
AG Ratio: 1.7 (calc) (ref 1.0–2.5)
ALT: 62 U/L — ABNORMAL HIGH (ref 9–46)
AST: 33 U/L (ref 10–40)
Albumin: 4.2 g/dL (ref 3.6–5.1)
Alkaline phosphatase (APISO): 101 U/L (ref 36–130)
BUN/Creatinine Ratio: 23 (calc) — ABNORMAL HIGH (ref 6–22)
BUN: 13 mg/dL (ref 7–25)
CO2: 23 mmol/L (ref 20–32)
Calcium: 8.9 mg/dL (ref 8.6–10.3)
Chloride: 105 mmol/L (ref 98–110)
Creat: 0.57 mg/dL — ABNORMAL LOW (ref 0.60–1.29)
Globulin: 2.5 g/dL (calc) (ref 1.9–3.7)
Glucose, Bld: 109 mg/dL — ABNORMAL HIGH (ref 65–99)
Potassium: 4.4 mmol/L (ref 3.5–5.3)
Sodium: 136 mmol/L (ref 135–146)
Total Bilirubin: 0.3 mg/dL (ref 0.2–1.2)
Total Protein: 6.7 g/dL (ref 6.1–8.1)
eGFR: 126 mL/min/{1.73_m2} (ref >=60)

## 2021-04-09 LAB — CBC WITH DIFFERENTIAL/PLATELET
Absolute Monocytes: 620 cells/uL (ref 200–950)
Basophils Absolute: 47 cells/uL (ref 0–200)
Basophils Relative: 0.5 %
Eosinophils Absolute: 179 cells/uL (ref 15–500)
Eosinophils Relative: 1.9 %
HCT: 41.7 % (ref 38.5–50.0)
Hemoglobin: 14.5 g/dL (ref 13.2–17.1)
Lymphs Abs: 4117 cells/uL — ABNORMAL HIGH (ref 850–3900)
MCH: 32.1 pg (ref 27.0–33.0)
MCHC: 34.8 g/dL (ref 32.0–36.0)
MCV: 92.3 fL (ref 80.0–100.0)
MPV: 11.2 fL (ref 7.5–12.5)
Monocytes Relative: 6.6 %
Neutro Abs: 4437 cells/uL (ref 1500–7800)
Neutrophils Relative %: 47.2 %
Platelets: 214 10*3/uL (ref 140–400)
RBC: 4.52 10*6/uL (ref 4.20–5.80)
RDW: 12.2 % (ref 11.0–15.0)
Total Lymphocyte: 43.8 %
WBC: 9.4 10*3/uL (ref 3.8–10.8)

## 2021-04-09 LAB — C. TRACHOMATIS/N. GONORRHOEAE RNA
C. trachomatis RNA, TMA: NOT DETECTED
N. gonorrhoeae RNA, TMA: NOT DETECTED

## 2021-04-09 LAB — T-HELPER CELLS (CD4) COUNT (NOT AT ARMC)
Absolute CD4: 412 cells/uL — ABNORMAL LOW (ref 490–1740)
CD4 T Helper %: 9 % — ABNORMAL LOW (ref 30–61)
Total lymphocyte count: 4432 cells/uL — ABNORMAL HIGH (ref 850–3900)

## 2021-04-09 LAB — HIV-1 RNA QUANT-NO REFLEX-BLD
HIV 1 RNA Quant: 195000 Copies/mL — ABNORMAL HIGH
HIV-1 RNA Quant, Log: 5.29 Log cps/mL — ABNORMAL HIGH

## 2021-04-09 LAB — RPR: RPR Ser Ql: NONREACTIVE

## 2021-06-06 ENCOUNTER — Ambulatory Visit: Payer: Self-pay | Admitting: Infectious Disease

## 2021-06-20 ENCOUNTER — Ambulatory Visit: Payer: Self-pay | Admitting: Infectious Disease

## 2021-10-14 ENCOUNTER — Telehealth: Payer: Self-pay

## 2021-10-14 NOTE — Telephone Encounter (Signed)
Patient with detectable viral load, offered appointment. He agrees to come see Dr. Daiva Eves next week 8/24. Reports that he has been doing well with his Symtuza and just picked up a refill two weeks ago.   Sandie Ano, RN

## 2021-10-20 ENCOUNTER — Other Ambulatory Visit: Payer: Self-pay | Admitting: Pharmacist

## 2021-10-20 ENCOUNTER — Encounter: Payer: Self-pay | Admitting: Infectious Disease

## 2021-10-20 ENCOUNTER — Ambulatory Visit (INDEPENDENT_AMBULATORY_CARE_PROVIDER_SITE_OTHER): Payer: Self-pay | Admitting: Infectious Disease

## 2021-10-20 ENCOUNTER — Ambulatory Visit: Payer: Self-pay

## 2021-10-20 ENCOUNTER — Other Ambulatory Visit: Payer: Self-pay

## 2021-10-20 VITALS — BP 121/83 | HR 86 | Temp 97.5°F | Wt 164.0 lb

## 2021-10-20 DIAGNOSIS — E785 Hyperlipidemia, unspecified: Secondary | ICD-10-CM

## 2021-10-20 DIAGNOSIS — F17200 Nicotine dependence, unspecified, uncomplicated: Secondary | ICD-10-CM

## 2021-10-20 DIAGNOSIS — Z7185 Encounter for immunization safety counseling: Secondary | ICD-10-CM

## 2021-10-20 DIAGNOSIS — B20 Human immunodeficiency virus [HIV] disease: Secondary | ICD-10-CM

## 2021-10-20 HISTORY — DX: Hyperlipidemia, unspecified: E78.5

## 2021-10-20 MED ORDER — PITAVASTATIN MAGNESIUM 4 MG PO TABS: 4.0000 mg | ORAL_TABLET | Freq: Every day | ORAL | 11 refills | Status: DC

## 2021-10-20 MED ORDER — SYMTUZA 800-150-200-10 MG PO TABS: 1.0000 | ORAL_TABLET | Freq: Every day | ORAL | 0 refills | Status: DC

## 2021-10-20 MED ORDER — SYMTUZA 800-150-200-10 MG PO TABS: 1.0000 | ORAL_TABLET | Freq: Every day | ORAL | 11 refills | Status: DC

## 2021-10-20 NOTE — Progress Notes (Signed)
Medication Samples have been provided to the patient.  Drug name: Symtuza        Strength: 800/150/200/10 mg Qty: 30  Tablets (1 bottles) LOT: [id number]   Exp.Date: 4/25  Dosing instructions: Take one tablet by mouth once daily with food  The patient has been instructed regarding the correct time, dose, and frequency of taking this medication, including desired effects and most common side effects.   Margarite Gouge, PharmD, CPP, BCIDP Clinical Pharmacist Practitioner Infectious Diseases Clinical Pharmacist [facility]
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
## 2021-10-20 NOTE — Progress Notes (Deleted)
   Subjective:    Patient ID: Curtis Burton, male    DOB: 06-25-1980, 41 y.o.   MRN: [id number]  HPI  Past Medical History:  Diagnosis Date   Anemia    HIV infection (HCC)    Retinitis    PERIFERAL FOCAL   Smoker 04/06/2021   UTI (urinary tract infection)    Vaccine counseling 04/06/2021    Past Surgical History:  Procedure Laterality Date   NO PAST SURGERIES      No family history on file.    Social History   Socioeconomic History   Marital status: Single    Spouse name: Not on file   Number of children: Not on file   Years of education: Not on file   Highest education level: Not on file  Occupational History   Not on file  Tobacco Use   Smoking status: Every Day    Packs/day: 0.25    Years: 15.00    Total pack years: 3.75    Types: Cigarettes   Smokeless tobacco: Never   Tobacco comments:    slowing down on ciggs  Substance and Sexual Activity   Alcohol use: Yes    Alcohol/week: 2.0 standard drinks of alcohol    Types: 2 Shots of liquor per week    Comment: weekends   Drug use: No   Sexual activity: Yes    Partners: Female    Comment: pt declined condoms  Other Topics Concern   Not on file  Social History Narrative   Not on file   Social Determinants of Health   Financial Resource Strain: Not on file  Food Insecurity: Not on file  Transportation Needs: Not on file  Physical Activity: Not on file  Stress: Not on file  Social Connections: Not on file    No Known Allergies   Current Outpatient Medications:    Darunavir-Cobicistat-Emtricitabine-Tenofovir Alafenamide (SYMTUZA) 800-150-200-10 MG TABS, Take 1 tablet by mouth daily with breakfast., Disp: 30 tablet, Rfl: 11   Darunavir-Cobicistat-Emtricitabine-Tenofovir Alafenamide (SYMTUZA) 800-150-200-10 MG TABS, Take 1 tablet by mouth daily with breakfast., Disp: 30 tablet, Rfl: 0   varenicline (CHANTIX CONTINUING MONTH PAK) 1 MG tablet, Take 1 tablet (1 mg total) by mouth 2 (two) times  daily., Disp: 60 tablet, Rfl: 1   Review of Systems     Objective:   Physical Exam        Assessment & Plan:

## 2021-10-21 LAB — URINE CYTOLOGY ANCILLARY ONLY
Chlamydia: NEGATIVE
Comment: NEGATIVE
Comment: NORMAL
Neisseria Gonorrhea: NEGATIVE

## 2021-10-21 LAB — T-HELPER CELLS (CD4) COUNT (NOT AT ARMC)
CD4 % Helper T Cell: 14 % — ABNORMAL LOW (ref 33–65)
CD4 T Cell Abs: 278 /uL — ABNORMAL LOW (ref 400–1790)

## 2021-10-28 LAB — CBC WITH DIFFERENTIAL/PLATELET
Absolute Monocytes: 465 cells/uL (ref 200–950)
Basophils Absolute: 10 cells/uL (ref 0–200)
Basophils Relative: 0.2 %
Eosinophils Absolute: 170 cells/uL (ref 15–500)
Eosinophils Relative: 3.4 %
HCT: 40.2 % (ref 38.5–50.0)
Hemoglobin: 14.3 g/dL (ref 13.2–17.1)
Lymphs Abs: 2005 cells/uL (ref 850–3900)
MCH: 33.3 pg — ABNORMAL HIGH (ref 27.0–33.0)
MCHC: 35.6 g/dL (ref 32.0–36.0)
MCV: 93.5 fL (ref 80.0–100.0)
MPV: 11.2 fL (ref 7.5–12.5)
Monocytes Relative: 9.3 %
Neutro Abs: 2350 cells/uL (ref 1500–7800)
Neutrophils Relative %: 47 %
Platelets: 262 10*3/uL (ref 140–400)
RBC: 4.3 10*6/uL (ref 4.20–5.80)
RDW: 12.8 % (ref 11.0–15.0)
Total Lymphocyte: 40.1 %
WBC: 5 10*3/uL (ref 3.8–10.8)

## 2021-10-28 LAB — HIV-1 RNA ULTRAQUANT REFLEX TO GENTYP+
HIV 1 RNA Quant: 822 copies/mL — ABNORMAL HIGH
HIV-1 RNA Quant, Log: 2.91 Log copies/mL — ABNORMAL HIGH

## 2021-10-28 LAB — COMPLETE METABOLIC PANEL WITH GFR
AG Ratio: 1.8 (calc) (ref 1.0–2.5)
ALT: 31 U/L (ref 9–46)
AST: 24 U/L (ref 10–40)
Albumin: 4.3 g/dL (ref 3.6–5.1)
Alkaline phosphatase (APISO): 93 U/L (ref 36–130)
BUN: 16 mg/dL (ref 7–25)
CO2: 22 mmol/L (ref 20–32)
Calcium: 9.2 mg/dL (ref 8.6–10.3)
Chloride: 106 mmol/L (ref 98–110)
Creat: 0.75 mg/dL (ref 0.60–1.29)
Globulin: 2.4 g/dL (calc) (ref 1.9–3.7)
Glucose, Bld: 99 mg/dL (ref 65–99)
Potassium: 4.5 mmol/L (ref 3.5–5.3)
Sodium: 137 mmol/L (ref 135–146)
Total Bilirubin: 0.6 mg/dL (ref 0.2–1.2)
Total Protein: 6.7 g/dL (ref 6.1–8.1)
eGFR: 115 mL/min/{1.73_m2} (ref >=60)

## 2021-10-28 LAB — LIPID PANEL
Cholesterol: 172 mg/dL (ref <200)
HDL: 43 mg/dL (ref >=40)
LDL Cholesterol (Calc): 98 mg/dL (calc)
Non-HDL Cholesterol (Calc): 129 mg/dL (calc) (ref <130)
Total CHOL/HDL Ratio: 4 (calc) (ref <5.0)
Triglycerides: 217 mg/dL — ABNORMAL HIGH (ref <150)

## 2021-10-28 LAB — RPR: RPR Ser Ql: NONREACTIVE

## 2021-10-28 LAB — HIV-1 GENOTYPE: HIV-1 Genotype: DETECTED — AB

## 2021-12-27 NOTE — Progress Notes (Deleted)
Subjective:   Chief complaint :     Patient ID: Curtis Burton, male    DOB: [birth year], 41 y.o.   MRN: [id number]  HPI 41 year-old Hispanic man withdiagnosed HIV AIDS CMV retinitis and  PCP pneumonia (latter dx made clinically).      Curtis Burton had a lapse in his antiviral regimen for almost 4 months or longer and had a viral load over 100,000 I last saw him he did enroll in HIV medication assistance plan and we gave him samples of SYMTUZA.  Have not seen him however since this winter.  He appears to be feeling his SYMTUZA almost every month or sometimes it appears he skips a month or pills the meds several weeks later. \      Past Medical History:  Diagnosis Date   Anemia    HIV infection (HCC)    Hyperlipidemia 10/20/2021   Retinitis    PERIFERAL FOCAL   Smoker 04/06/2021   UTI (urinary tract infection)    Vaccine counseling 04/06/2021    Past Surgical History:  Procedure Laterality Date   NO PAST SURGERIES      No family history on file.    Social History   Socioeconomic History   Marital status: Single    Spouse name: Not on file   Number of children: Not on file   Years of education: Not on file   Highest education level: Not on file  Occupational History   Not on file  Tobacco Use   Smoking status: Every Day    Packs/day: 0.25    Years: 15.00    Total pack years: 3.75    Types: Cigarettes   Smokeless tobacco: Never   Tobacco comments:    slowing down on ciggs  Substance and Sexual Activity   Alcohol use: Yes    Alcohol/week: 2.0 standard drinks of alcohol    Types: 2 Shots of liquor per week    Comment: weekends   Drug use: No   Sexual activity: Yes    Partners: Female    Comment: pt declined condoms  Other Topics Concern   Not on file  Social History Narrative   Not on file   Social Determinants of Health   Financial Resource Strain: Not on file  Food Insecurity: Not on file  Transportation Needs: Not on file  Physical Activity:  Not on file  Stress: Not on file  Social Connections: Not on file    No Known Allergies   Current Outpatient Medications:    Darunavir-Cobicistat-Emtricitabine-Tenofovir Alafenamide (SYMTUZA) 800-150-200-10 MG TABS, Take 1 tablet by mouth daily with breakfast., Disp: 30 tablet, Rfl: 11   Darunavir-Cobicistat-Emtricitabine-Tenofovir Alafenamide (SYMTUZA) 800-150-200-10 MG TABS, Take 1 tablet by mouth daily with breakfast., Disp: 30 tablet, Rfl: 0   Pitavastatin Magnesium 4 MG TABS, Take 4 mg by mouth daily., Disp: 30 tablet, Rfl: 11   varenicline (CHANTIX CONTINUING MONTH PAK) 1 MG tablet, Take 1 tablet (1 mg total) by mouth 2 (two) times daily., Disp: 60 tablet, Rfl: 1   Review of Systems  Constitutional:  Negative for activity change, appetite change, chills, diaphoresis, fatigue, fever and unexpected weight change.  HENT:  Negative for congestion, rhinorrhea, sinus pressure, sneezing, sore throat and trouble swallowing.   Eyes:  Negative for photophobia and visual disturbance.  Respiratory:  Negative for cough, chest tightness, shortness of breath, wheezing and stridor.   Cardiovascular:  Negative for chest pain, palpitations and leg swelling.  Gastrointestinal:  Negative for abdominal distention, abdominal  pain, anal bleeding, blood in stool, constipation, diarrhea, nausea and vomiting.  Genitourinary:  Negative for difficulty urinating, dysuria, flank pain and hematuria.  Musculoskeletal:  Negative for arthralgias, back pain, gait problem, joint swelling and myalgias.  Skin:  Negative for color change, pallor, rash and wound.  Neurological:  Negative for dizziness, tremors, weakness and light-headedness.  Hematological:  Negative for adenopathy. Does not bruise/bleed easily.  Psychiatric/Behavioral:  Negative for agitation, behavioral problems, confusion, decreased concentration, dysphoric mood and sleep disturbance.        Objective:   Physical Exam Constitutional:       Appearance: He is well-developed.  HENT:     Head: Normocephalic and atraumatic.  Eyes:     Conjunctiva/sclera: Conjunctivae normal.  Cardiovascular:     Rate and Rhythm: Normal rate and regular rhythm.  Pulmonary:     Effort: Pulmonary effort is normal. No respiratory distress.     Breath sounds: No wheezing.  Abdominal:     General: There is no distension.     Palpations: Abdomen is soft.  Musculoskeletal:        General: No tenderness. Normal range of motion.     Cervical back: Normal range of motion and neck supple.  Skin:    General: Skin is warm and dry.     Coloration: Skin is not pale.     Findings: No erythema or rash.  Neurological:     General: No focal deficit present.     Mental Status: He is alert and oriented to person, place, and time.  Psychiatric:        Mood and Affect: Mood normal.        Behavior: Behavior normal.        Thought Content: Thought content normal.        Judgment: Judgment normal.         Assessment & Plan:   HIV disease:  I will add order HIV viral load CD4 count CBC with differential CMP, RPR GC and chlamydia and I will continue  Curtis Burton's  [facility] prescription  Hyperlipidemia: continue pitavastatin  Smoking: counseled re smoking cesssaion  Vaccine counseling: recommended flu and COVID vaccines

## 2021-12-28 ENCOUNTER — Ambulatory Visit: Payer: Self-pay | Admitting: Infectious Disease

## 2021-12-28 ENCOUNTER — Telehealth: Payer: Self-pay

## 2021-12-28 DIAGNOSIS — B20 Human immunodeficiency virus [HIV] disease: Secondary | ICD-10-CM

## 2021-12-28 DIAGNOSIS — F17200 Nicotine dependence, unspecified, uncomplicated: Secondary | ICD-10-CM

## 2021-12-28 DIAGNOSIS — Z7185 Encounter for immunization safety counseling: Secondary | ICD-10-CM

## 2021-12-28 NOTE — Telephone Encounter (Signed)
Detectable VL Intervention Patient's last detectable viral load was 822 on 10/20/21. Current ART regimen/medication is Symtuza.  The patient does have a future appointment scheduled for 01/02/22.(Rescheduled missed visit today)  Called [facility] to assess for any possible medication adherence and barriers to care issues  the patient may be experiencing.   Medication Adherence: Patient verbalized pharmacy he is using and how to request for refills.   Patient declines access for Mychart at this time.   Patient declined missing any doses of medication within the past 30 days.   Patient declined any other barriers such as house needs/ food insecurities, or transportation needs.   Intervention Given: Referral to [facility], Encouraged patient to continue to take Symtuza and Pitavastain daily as prescribed. Patient verbalized understanding.   Referral Outcome: Patient will meet with [facility] at upcoming visit for intake for medication adherence and maintaining scheduled appointments.   Eugenia Mcalpine, LPN

## 2022-01-02 ENCOUNTER — Ambulatory Visit: Payer: Self-pay | Admitting: Infectious Disease

## 2022-03-22 ENCOUNTER — Ambulatory Visit: Payer: Self-pay | Admitting: Infectious Disease

## 2022-04-27 ENCOUNTER — Ambulatory Visit: Payer: Self-pay | Admitting: Infectious Disease

## 2022-12-11 ENCOUNTER — Other Ambulatory Visit: Payer: Self-pay

## 2022-12-11 ENCOUNTER — Telehealth: Payer: Self-pay

## 2022-12-11 MED ORDER — SYMTUZA 800-150-200-10 MG PO TABS: 1.0000 | ORAL_TABLET | Freq: Every day | ORAL | 0 refills | Status: DC

## 2022-12-11 NOTE — Telephone Encounter (Signed)
Looks like patient needs to renew?

## 2022-12-11 NOTE — Telephone Encounter (Signed)
Patient's significant other called to set an appt for both her and the patient. Was able to schedule follow ups, but she stated she was unable to refill patients prescription (Symtuza). I told her I was unable to disclose this, but I will notify the clinical staff to check on this and reach out to the patient in regards to refill concerns. She expressed understanding.   Due to scheduling needs he has been scheduled for 12/29/2022.

## 2022-12-29 ENCOUNTER — Ambulatory Visit (INDEPENDENT_AMBULATORY_CARE_PROVIDER_SITE_OTHER): Payer: Self-pay | Admitting: Family

## 2022-12-29 ENCOUNTER — Ambulatory Visit: Payer: Self-pay

## 2022-12-29 ENCOUNTER — Encounter: Payer: Self-pay | Admitting: Family

## 2022-12-29 ENCOUNTER — Other Ambulatory Visit: Payer: Self-pay

## 2022-12-29 VITALS — BP 134/83 | HR 92 | Temp 97.6°F | Ht 64.0 in | Wt 168.0 lb

## 2022-12-29 DIAGNOSIS — Z0000 Encounter for general adult medical examination without abnormal findings: Secondary | ICD-10-CM | POA: Insufficient documentation

## 2022-12-29 DIAGNOSIS — B20 Human immunodeficiency virus [HIV] disease: Secondary | ICD-10-CM

## 2022-12-29 DIAGNOSIS — E785 Hyperlipidemia, unspecified: Secondary | ICD-10-CM

## 2022-12-29 DIAGNOSIS — F17200 Nicotine dependence, unspecified, uncomplicated: Secondary | ICD-10-CM

## 2022-12-29 DIAGNOSIS — Z23 Encounter for immunization: Secondary | ICD-10-CM

## 2022-12-29 MED ORDER — PITAVASTATIN MAGNESIUM 4 MG PO TABS: 4.0000 mg | ORAL_TABLET | Freq: Every day | ORAL | 5 refills | Status: DC

## 2022-12-29 MED ORDER — SYMTUZA 800-150-200-10 MG PO TABS: 1.0000 | ORAL_TABLET | Freq: Every day | ORAL | 5 refills | Status: DC

## 2022-12-29 NOTE — Assessment & Plan Note (Signed)
Discussed importance of safe sexual practice and condom use. Condoms and STD testing offered.  Vaccinations reviewed - tetanus and influenza updated. Dental referral placed to [facility] for routine care.

## 2022-12-29 NOTE — Assessment & Plan Note (Signed)
Tolerating pitavastatin with no adverse side effects or myalgias. Check lipid profile. Discussed how statin medication also help to reduce risk of HIV associated inflammation.

## 2022-12-29 NOTE — Assessment & Plan Note (Signed)
Curtis Burton appears to have good adherence and tolerance to Symtuza and hopefully will be improved from previous blood work and will be undetectable. Unlikely that he would develop any resistance to Symtuza. Reviewed previous blood work and discussed plan of care and U equals U. Check blood work. Renew financial assistance. Continue current dose of Symtuza. Plan for follow up with Dr. Daiva Eves in 4 months or sooner if needed.

## 2022-12-29 NOTE — Patient Instructions (Addendum)
Nice to see you.  We will check your lab work today.  Continue to take your medication daily as prescribed.  Refills have been sent to the pharmacy.  Plan for follow up with Dr. Daiva Eves in 4 months or sooner if needed with lab work on the same day.  Have a great day and stay safe!

## 2022-12-29 NOTE — Progress Notes (Signed)
Brief Narrative   Patient ID: Curtis Burton, male    DOB: 26-May-1980, 42 y.o.   MRN: [id number]  [name] is a 42 y/o Hispanic male diagnosed with HIV-1 disease in September 2013 with risk factor of heterosexual contact. Initial viral load 465,865 with CD4 count 20. Entered care at [facility] Stage 3. Genotype with no significant medication resistant mutations. History of CMV retinitis s/p treatment with gancyclovir. Quantiferon Gold and [id number] negative. ART experienced with Atripla, Prezcobix/Descovy, and Symtuza.   Subjective:    Chief Complaint  Patient presents with   Follow-up    B20    HPI:  Curtis Burton is a 42 y.o. male with HIV disease last seen by Dr. Daiva Eves on 10/20/22 with less than optimally controlled virus and good tolerance to Symtuza. Viral load was 822 with CD4 count 278.  STD testing was negative. Kidney function, liver function and electrolytes within normal ranges.   Mr. Rodriguez's primary preferred language is Spanish and per his request his wife is is present to aid in communication. Missed a refill of medication secondary to not having refills but was only off medication for about a week. Feeling good with no new concerns/complains. Financial coverage through [employer] and needing renewal. Condoms and STD testing offered. Vaccinations reviewed and due for routine dental care.   Denies fevers, chills, night sweats, headaches, changes in vision, neck pain/stiffness, nausea, diarrhea, vomiting, lesions or rashes.  Lab Results  Component Value Date   CD4TCELL 14 (L) 10/20/2021   CD4TABS 278 (L) 10/20/2021   Lab Results  Component Value Date   HIV1RNAQUANT 822 (H) 10/20/2021     No Known Allergies    Outpatient Medications Prior to Visit  Medication Sig Dispense Refill   Darunavir-Cobicistat-Emtricitabine-Tenofovir Alafenamide (SYMTUZA) 800-150-200-10 MG TABS Take 1 tablet by mouth daily with breakfast. 30 tablet 0   Pitavastatin  Magnesium 4 MG TABS Take 4 mg by mouth daily. 30 tablet 11   Darunavir-Cobicistat-Emtricitabine-Tenofovir Alafenamide (SYMTUZA) 800-150-200-10 MG TABS Take 1 tablet by mouth daily with breakfast. 30 tablet 0   varenicline (CHANTIX CONTINUING MONTH PAK) 1 MG tablet Take 1 tablet (1 mg total) by mouth 2 (two) times daily. (Patient not taking: Reported on 12/29/2022) 60 tablet 1   No facility-administered medications prior to visit.     Past Medical History:  Diagnosis Date   Anemia    HIV infection (HCC)    Hyperlipidemia 10/20/2021   Retinitis    PERIFERAL FOCAL   Smoker 04/06/2021   UTI (urinary tract infection)    Vaccine counseling 04/06/2021     Past Surgical History:  Procedure Laterality Date   NO PAST SURGERIES        Review of Systems  Constitutional:  Negative for appetite change, chills, fatigue, fever and unexpected weight change.  Eyes:  Negative for visual disturbance.  Respiratory:  Negative for cough, chest tightness, shortness of breath and wheezing.   Cardiovascular:  Negative for chest pain and leg swelling.  Gastrointestinal:  Negative for abdominal pain, constipation, diarrhea, nausea and vomiting.  Genitourinary:  Negative for dysuria, flank pain, frequency, genital sores, hematuria and urgency.  Skin:  Negative for rash.  Allergic/Immunologic: Negative for immunocompromised state.  Neurological:  Negative for dizziness and headaches.      Objective:    BP 134/83   Pulse 92   Temp 97.6 F (36.4 C) (Temporal)   Ht 5\' 4"  (1.626 m)   Wt 168 lb (76.2 kg)   SpO2  97%   BMI 28.84 kg/m  Nursing note and vital signs reviewed.  Physical Exam Constitutional:      General: He is not in acute distress.    Appearance: He is well-developed.  Eyes:     Conjunctiva/sclera: Conjunctivae normal.  Cardiovascular:     Rate and Rhythm: Normal rate and regular rhythm.     Heart sounds: Normal heart sounds. No murmur heard.    No friction rub. No gallop.   Pulmonary:     Effort: Pulmonary effort is normal. No respiratory distress.     Breath sounds: Normal breath sounds. No wheezing or rales.  Chest:     Chest wall: No tenderness.  Abdominal:     General: Bowel sounds are normal.     Palpations: Abdomen is soft.     Tenderness: There is no abdominal tenderness.  Musculoskeletal:     Cervical back: Neck supple.  Lymphadenopathy:     Cervical: No cervical adenopathy.  Skin:    General: Skin is warm and dry.     Findings: No rash.  Neurological:     Mental Status: He is alert and oriented to person, place, and time.  Psychiatric:        Behavior: Behavior normal.        Thought Content: Thought content normal.        Judgment: Judgment normal.         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 Unlikely that he would develop any resistance to Symtuza. Reviewed previous blood work and discussed plan of care and U equals U. Check blood work. Renew financial assistance. Continue current dose of Symtuza. Plan for follow up with Dr. Daiva Eves in 4 months  or sooner if needed.       Relevant Medications   Darunavir-Cobicistat-Emtricitabine-Tenofovir Alafenamide (SYMTUZA) 800-150-200-10 MG TABS   Other Relevant Orders   COMPLETE METABOLIC PANEL WITH GFR   HIV-1 RNA quant-no reflex-bld   T-helper cells (CD4) count (not at [facility])   AMB REFERRAL TO COMMUNITY SERVICE AGENCY   Smoker    Continues to smoke tobacco daily. Counseled on importance of tobacco cessation to reduce risk of complications or development of disease in the future. Not ready to quit and in the pre-contemplation stage of change.       Hyperlipidemia    Tolerating pitavastatin with no adverse side effects or  myalgias. Check lipid profile. Discussed how statin medication also help to reduce risk of HIV associated inflammation.       Relevant Medications   Pitavastatin Magnesium 4 MG TABS   Other Relevant Orders   Lipid panel   Healthcare maintenance    Discussed importance of safe sexual practice and condom use. Condoms and STD testing offered.  Vaccinations reviewed - tetanus and influenza updated. Dental referral placed to [facility] for routine care.       Other Visit Diagnoses     Encounter for immunization       Relevant Orders   Flu vaccine trivalent PF, 6mos and older(Flulaval,Afluria,Fluarix,Fluzone) (Completed)   Need for diphtheria-tetanus-pertussis (Tdap) vaccine       Relevant Orders   Tdap vaccine greater than or equal to 7yo IM (Completed)        I have discontinued Sergi Gellner Urbieta's varenicline. I have also changed his Pitavastatin Magnesium. Additionally, I am having him maintain his Symtuza.   Meds ordered this encounter  Medications   Darunavir-Cobicistat-Emtricitabine-Tenofovir Alafenamide (SYMTUZA) 800-150-200-10 MG TABS    Sig: Take 1 tablet by mouth daily with breakfast.    Dispense:  30 tablet    Refill:  5    Order Specific Question:   Supervising Provider    Answer:   Judyann Munson [phone]    Order Specific Question:    Prescription Type:    Answer:   Renewal   Pitavastatin Magnesium 4 MG TABS    Sig: Take 1 tablet (4 mg total) by mouth daily.    Dispense:  30 tablet    Refill:  5    Order Specific Question:   Supervising Provider    Answer:   Judyann Munson [[id number]]     Follow-up: Return in about 4 months (around 04/28/2023), or if symptoms worsen or fail to improve. or sooner if needed.    Marcos Eke, MSN, FNP-C Nurse Practitioner [facility] Main number: [phone]

## 2022-12-29 NOTE — Assessment & Plan Note (Signed)
Continues to smoke tobacco daily. Counseled on importance of tobacco cessation to reduce risk of complications or development of disease in the future. Not ready to quit and in the pre-contemplation stage of change.

## 2023-01-01 LAB — COMPLETE METABOLIC PANEL WITH GFR
AG Ratio: 1.6 (calc) (ref 1.0–2.5)
ALT: 37 U/L (ref 9–46)
AST: 22 U/L (ref 10–40)
Albumin: 4.1 g/dL (ref 3.6–5.1)
Alkaline phosphatase (APISO): 109 U/L (ref 36–130)
BUN: 16 mg/dL (ref 7–25)
CO2: 24 mmol/L (ref 20–32)
Calcium: 8.9 mg/dL (ref 8.6–10.3)
Chloride: 104 mmol/L (ref 98–110)
Creat: 0.61 mg/dL (ref 0.60–1.29)
Globulin: 2.6 g/dL (ref 1.9–3.7)
Glucose, Bld: 162 mg/dL — ABNORMAL HIGH (ref 65–99)
Potassium: 3.7 mmol/L (ref 3.5–5.3)
Sodium: 136 mmol/L (ref 135–146)
Total Bilirubin: 0.6 mg/dL (ref 0.2–1.2)
Total Protein: 6.7 g/dL (ref 6.1–8.1)
eGFR: 121 mL/min/{1.73_m2} (ref >=60)

## 2023-01-01 LAB — LIPID PANEL
Cholesterol: 170 mg/dL (ref <200)
HDL: 50 mg/dL (ref >=40)
LDL Cholesterol (Calc): 92 mg/dL
Non-HDL Cholesterol (Calc): 120 mg/dL (ref <130)
Total CHOL/HDL Ratio: 3.4 (calc) (ref <5.0)
Triglycerides: 180 mg/dL — ABNORMAL HIGH (ref <150)

## 2023-01-01 LAB — T-HELPER CELLS (CD4) COUNT (NOT AT ARMC)
Absolute CD4: 478 {cells}/uL — ABNORMAL LOW (ref 490–1740)
CD4 T Helper %: 17 % — ABNORMAL LOW (ref 30–61)
Total lymphocyte count: 2822 {cells}/uL (ref 850–3900)

## 2023-01-01 LAB — HIV-1 RNA QUANT-NO REFLEX-BLD
HIV 1 RNA Quant: 60 {copies}/mL — ABNORMAL HIGH
HIV-1 RNA Quant, Log: 1.78 {Log_copies}/mL — ABNORMAL HIGH

## 2023-01-24 ENCOUNTER — Ambulatory Visit: Payer: Self-pay | Admitting: Infectious Disease

## 2023-04-24 ENCOUNTER — Other Ambulatory Visit: Payer: Self-pay

## 2023-04-24 ENCOUNTER — Encounter: Payer: Self-pay | Admitting: Infectious Disease

## 2023-04-24 ENCOUNTER — Ambulatory Visit (INDEPENDENT_AMBULATORY_CARE_PROVIDER_SITE_OTHER): Payer: Self-pay | Admitting: Infectious Disease

## 2023-04-24 VITALS — BP 120/80 | HR 98 | Temp 97.8°F | Ht 64.0 in | Wt 174.0 lb

## 2023-04-24 DIAGNOSIS — B20 Human immunodeficiency virus [HIV] disease: Secondary | ICD-10-CM

## 2023-04-24 DIAGNOSIS — E785 Hyperlipidemia, unspecified: Secondary | ICD-10-CM

## 2023-04-24 DIAGNOSIS — Z7185 Encounter for immunization safety counseling: Secondary | ICD-10-CM

## 2023-04-24 MED ORDER — PITAVASTATIN MAGNESIUM 4 MG PO TABS: 4.0000 mg | ORAL_TABLET | Freq: Every day | ORAL | 11 refills | Status: DC

## 2023-04-24 MED ORDER — SYMTUZA 800-150-200-10 MG PO TABS: 1.0000 | ORAL_TABLET | Freq: Every day | ORAL | 11 refills | Status: DC

## 2023-04-24 NOTE — Progress Notes (Signed)
 Subjective:   Chief complaint: follow-up for HIV disease on medications   Patient ID: Curtis Burton, male    DOB: [birth year], 43 y.o.   MRN: [id number]  HPI  Discussed the use of AI scribe software for clinical note transcription with the patient, who gave verbal consent to proceed.  History of Present Illness   The patient, with a history of HIV and hyperlipidemia, presents for a routine follow-up. He is currently on Symtuza for HIV management and pitavastatin for cholesterol control and inflammation reduction. He also received vaccinations for flu and tetanus during his last visit. The patient does not report any new symptoms or concerns during this visit.       Past Medical History:  Diagnosis Date   Anemia    HIV infection (HCC)    Hyperlipidemia 10/20/2021   Retinitis    PERIFERAL FOCAL   Smoker 04/06/2021   UTI (urinary tract infection)    Vaccine counseling 04/06/2021    Past Surgical History:  Procedure Laterality Date   NO PAST SURGERIES      No family history on file.    Social History   Socioeconomic History   Marital status: Single    Spouse name: Not on file   Number of children: Not on file   Years of education: Not on file   Highest education level: Not on file  Occupational History   Not on file  Tobacco Use   Smoking status: Every Day    Current packs/day: 0.25    Average packs/day: 0.3 packs/day for 15.0 years (3.8 ttl pk-yrs)    Types: Cigarettes   Smokeless tobacco: Never   Tobacco comments:    slowing down on cigs    1 pack every 2 days  Substance and Sexual Activity   Alcohol use: Yes    Alcohol/week: 2.0 standard drinks of alcohol    Types: 2 Shots of liquor per week    Comment: weekends   Drug use: No   Sexual activity: Yes    Partners: Female  Other Topics Concern   Not on file  Social History Narrative   Not on file   Social Drivers of Health   Financial Resource Strain: Not on file  Food Insecurity: Not on file   Transportation Needs: Not on file  Physical Activity: Not on file  Stress: Not on file  Social Connections: Not on file    No Known Allergies   Current Outpatient Medications:    Darunavir-Cobicistat-Emtricitabine-Tenofovir Alafenamide (SYMTUZA) 800-150-200-10 MG TABS, Take 1 tablet by mouth daily with breakfast., Disp: 30 tablet, Rfl: 11   Pitavastatin Magnesium 4 MG TABS, Take 1 tablet (4 mg total) by mouth daily., Disp: 30 tablet, Rfl: 11   Review of Systems  Constitutional:  Negative for activity change, appetite change, chills, diaphoresis, fatigue, fever and unexpected weight change.  HENT:  Negative for congestion, rhinorrhea, sinus pressure, sneezing, sore throat and trouble swallowing.   Eyes:  Negative for photophobia and visual disturbance.  Respiratory:  Negative for cough, chest tightness, shortness of breath, wheezing and stridor.   Cardiovascular:  Negative for chest pain, palpitations and leg swelling.  Gastrointestinal:  Negative for abdominal distention, abdominal pain, anal bleeding, blood in stool, constipation, diarrhea, nausea and vomiting.  Genitourinary:  Negative for difficulty urinating, dysuria, flank pain and hematuria.  Musculoskeletal:  Negative for arthralgias, back pain, gait problem, joint swelling and myalgias.  Skin:  Negative for color change, pallor, rash and wound.  Neurological:  Negative  for dizziness, tremors, weakness and light-headedness.  Hematological:  Negative for adenopathy. Does not bruise/bleed easily.  Psychiatric/Behavioral:  Negative for agitation, behavioral problems, confusion, decreased concentration, dysphoric mood and sleep disturbance.        Objective:   Physical Exam Constitutional:      Appearance: He is well-developed.  HENT:     Head: Normocephalic and atraumatic.  Eyes:     Conjunctiva/sclera: Conjunctivae normal.  Cardiovascular:     Rate and Rhythm: Normal rate and regular rhythm.  Pulmonary:     Effort:  Pulmonary effort is normal. No respiratory distress.     Breath sounds: No wheezing.  Abdominal:     General: There is no distension.     Palpations: Abdomen is soft.  Musculoskeletal:        General: No tenderness. Normal range of motion.     Cervical back: Normal range of motion and neck supple.  Skin:    General: Skin is warm and dry.     Coloration: Skin is not pale.     Findings: No erythema or rash.  Neurological:     General: No focal deficit present.     Mental Status: He is alert and oriented to person, place, and time.  Psychiatric:        Mood and Affect: Mood normal.        Behavior: Behavior normal.        Thought Content: Thought content normal.        Judgment: Judgment normal.           Assessment & Plan:   Assessment and Plan    HIV Stable on Symtuza. -Order HIV RNA, CD4 routine labs --continue Symtuza  Hyperlipidemia On Pitavastatin. -Continue Pitavastatin.    General Health Maintenance Up to date on vaccinations. -Continue current vaccination schedule.

## 2023-04-25 LAB — URINE CYTOLOGY ANCILLARY ONLY
Chlamydia: NEGATIVE
Comment: NEGATIVE
Comment: NORMAL
Neisseria Gonorrhea: NEGATIVE

## 2023-04-25 LAB — T-HELPER CELLS (CD4) COUNT (NOT AT ARMC)
CD4 % Helper T Cell: 19 % — ABNORMAL LOW (ref 33–65)
CD4 T Cell Abs: 430 /uL (ref 400–1790)

## 2023-04-26 LAB — CBC WITH DIFFERENTIAL/PLATELET
Absolute Lymphocytes: 2401 {cells}/uL (ref 850–3900)
Absolute Monocytes: 531 {cells}/uL (ref 200–950)
Basophils Absolute: 30 {cells}/uL (ref 0–200)
Basophils Relative: 0.5 %
Eosinophils Absolute: 248 {cells}/uL (ref 15–500)
Eosinophils Relative: 4.2 %
HCT: 41.3 % (ref 38.5–50.0)
Hemoglobin: 14.4 g/dL (ref 13.2–17.1)
MCH: 32.6 pg (ref 27.0–33.0)
MCHC: 34.9 g/dL (ref 32.0–36.0)
MCV: 93.4 fL (ref 80.0–100.0)
MPV: 11 fL (ref 7.5–12.5)
Monocytes Relative: 9 %
Neutro Abs: 2690 {cells}/uL (ref 1500–7800)
Neutrophils Relative %: 45.6 %
Platelets: 282 10*3/uL (ref 140–400)
RBC: 4.42 10*6/uL (ref 4.20–5.80)
RDW: 12.2 % (ref 11.0–15.0)
Total Lymphocyte: 40.7 %
WBC: 5.9 10*3/uL (ref 3.8–10.8)

## 2023-04-26 LAB — COMPLETE METABOLIC PANEL WITH GFR
AG Ratio: 1.7 (calc) (ref 1.0–2.5)
ALT: 30 U/L (ref 9–46)
AST: 20 U/L (ref 10–40)
Albumin: 4.4 g/dL (ref 3.6–5.1)
Alkaline phosphatase (APISO): 95 U/L (ref 36–130)
BUN: 13 mg/dL (ref 7–25)
CO2: 25 mmol/L (ref 20–32)
Calcium: 9.6 mg/dL (ref 8.6–10.3)
Chloride: 103 mmol/L (ref 98–110)
Creat: 0.85 mg/dL (ref 0.60–1.29)
Globulin: 2.6 g/dL (ref 1.9–3.7)
Glucose, Bld: 145 mg/dL — ABNORMAL HIGH (ref 65–99)
Potassium: 4.3 mmol/L (ref 3.5–5.3)
Sodium: 137 mmol/L (ref 135–146)
Total Bilirubin: 0.7 mg/dL (ref 0.2–1.2)
Total Protein: 7 g/dL (ref 6.1–8.1)
eGFR: 110 mL/min/{1.73_m2} (ref >=60)

## 2023-04-26 LAB — LIPID PANEL
Cholesterol: 203 mg/dL — ABNORMAL HIGH (ref <200)
HDL: 50 mg/dL (ref >=40)
LDL Cholesterol (Calc): 124 mg/dL — ABNORMAL HIGH
Non-HDL Cholesterol (Calc): 153 mg/dL — ABNORMAL HIGH (ref <130)
Total CHOL/HDL Ratio: 4.1 (calc) (ref <5.0)
Triglycerides: 173 mg/dL — ABNORMAL HIGH (ref <150)

## 2023-04-26 LAB — HIV-1 RNA QUANT-NO REFLEX-BLD
HIV 1 RNA Quant: 50 {copies}/mL — ABNORMAL HIGH
HIV-1 RNA Quant, Log: 1.7 {Log_copies}/mL — ABNORMAL HIGH

## 2023-04-26 LAB — RPR: RPR Ser Ql: NONREACTIVE

## 2023-05-07 ENCOUNTER — Other Ambulatory Visit: Payer: Self-pay | Admitting: Infectious Disease

## 2023-05-08 NOTE — Telephone Encounter (Signed)
 11 refills sent 04/24/23

## 2023-07-17 NOTE — Progress Notes (Signed)
 The 10-year ASCVD risk score (Arnett DK, et al., 2019) is: 4.6%   Values used to calculate the score:     Age: 43 years     Sex: Male     Is Non-Hispanic African American: No     Diabetic: No     Tobacco smoker: Yes     Systolic Blood Pressure: 120 mmHg     Is BP treated: No     HDL Cholesterol: 50 mg/dL     Total Cholesterol: 203 mg/dL  Currently prescribed pitavastatin  4 mg.  Jahseh Lucchese, BSN, RN

## 2023-07-30 ENCOUNTER — Ambulatory Visit: Payer: Self-pay | Admitting: Infectious Disease

## 2023-09-26 NOTE — Progress Notes (Unsigned)
 Subjective:  Chief complaint: follow-up for HIV disease on medications   Patient ID: Curtis Burton, male    DOB: Aug 22, 1980, 43 y.o.   MRN: [id number]  HPI  Discussed the use of AI scribe software for clinical note transcription with the patient, who gave verbal consent to proceed.  History of Present Illness   Curtis Burton is a 42 year old male with HIV who presents for routine follow-up.  He is on Symtuza  for HIV management. His viral load in February was fifty, close to the target of less than fifty. His CD4 count is four hundred and thirty, indicating a healthy immune system.       Past Medical History:  Diagnosis Date   Anemia    HIV infection (HCC)    Hyperlipidemia 10/20/2021   Retinitis    PERIFERAL FOCAL   Smoker 04/06/2021   UTI (urinary tract infection)    Vaccine counseling 04/06/2021    Past Surgical History:  Procedure Laterality Date   NO PAST SURGERIES      No family history on file.    Social History   Socioeconomic History   Marital status: Single    Spouse name: Not on file   Number of children: Not on file   Years of education: Not on file   Highest education level: Not on file  Occupational History   Not on file  Tobacco Use   Smoking status: Every Day    Current packs/day: 0.25    Average packs/day: 0.3 packs/day for 15.0 years (3.8 ttl pk-yrs)    Types: Cigarettes   Smokeless tobacco: Never   Tobacco comments:    slowing down on cigs    1 pack every 2 days  Substance and Sexual Activity   Alcohol use: Yes    Alcohol/week: 2.0 standard drinks of alcohol    Types: 2 Shots of liquor per week    Comment: weekends   Drug use: No   Sexual activity: Yes    Partners: Female  Other Topics Concern   Not on file  Social History Narrative   Not on file   Social Drivers of Health   Financial Resource Strain: Not on file  Food Insecurity: Not on file  Transportation Needs: Not on file  Physical Activity: Not on file   Stress: Not on file  Social Connections: Not on file    No Known Allergies   Current Outpatient Medications:    Darunavir -Cobicistat -Emtricitabine -Tenofovir  Alafenamide (SYMTUZA ) 800-150-200-10 MG TABS, Take 1 tablet by mouth daily with breakfast., Disp: 30 tablet, Rfl: 11   Pitavastatin  Magnesium  4 MG TABS, Take 1 tablet (4 mg total) by mouth daily., Disp: 30 tablet, Rfl: 11   Review of Systems  Constitutional:  Negative for activity change, appetite change, chills, diaphoresis, fatigue, fever and unexpected weight change.  HENT:  Negative for congestion, rhinorrhea, sinus pressure, sneezing, sore throat and trouble swallowing.   Eyes:  Negative for photophobia and visual disturbance.  Respiratory:  Negative for cough, chest tightness, shortness of breath, wheezing and stridor.   Cardiovascular:  Negative for chest pain, palpitations and leg swelling.  Gastrointestinal:  Negative for abdominal distention, abdominal pain, anal bleeding, blood in stool, constipation, diarrhea, nausea and vomiting.  Genitourinary:  Negative for difficulty urinating, dysuria, flank pain and hematuria.  Musculoskeletal:  Negative for arthralgias, back pain, gait problem, joint swelling and myalgias.  Skin:  Negative for color change, pallor, rash and wound.  Neurological:  Negative for dizziness, tremors, weakness  and light-headedness.  Hematological:  Negative for adenopathy. Does not bruise/bleed easily.  Psychiatric/Behavioral:  Negative for agitation, behavioral problems, confusion, decreased concentration, dysphoric mood and sleep disturbance.        Objective:   Physical Exam Constitutional:      Appearance: He is well-developed.  HENT:     Head: Normocephalic and atraumatic.  Eyes:     Conjunctiva/sclera: Conjunctivae normal.  Cardiovascular:     Rate and Rhythm: Normal rate and regular rhythm.  Pulmonary:     Effort: Pulmonary effort is normal. No respiratory distress.     Breath sounds:  No wheezing.  Abdominal:     General: There is no distension.     Palpations: Abdomen is soft.  Musculoskeletal:        General: No tenderness. Normal range of motion.     Cervical back: Normal range of motion and neck supple.  Skin:    General: Skin is warm and dry.     Coloration: Skin is not pale.     Findings: No erythema or rash.  Neurological:     General: No focal deficit present.     Mental Status: He is alert and oriented to person, place, and time.  Psychiatric:        Mood and Affect: Mood normal.        Behavior: Behavior normal.        Thought Content: Thought content normal.        Judgment: Judgment normal.           Assessment & Plan:   Assessment and Plan    Human immunodeficiency virus (HIV) infection HIV well-controlled with viral load 50 and CD4 count 430. On Symtuza . - Continue Symtuza . - Order labs including HIV RNA< CD4 etc  Hyperlipidemia Managed with pitavastatin , checking lipids and continuing pitavastatin    Hyperglycemia on blood draw in February check A1c  Vaccine counseling: recommended and he received HPV no 1

## 2023-09-27 ENCOUNTER — Encounter: Payer: Self-pay | Admitting: Infectious Disease

## 2023-09-27 ENCOUNTER — Ambulatory Visit: Payer: Self-pay

## 2023-09-27 ENCOUNTER — Other Ambulatory Visit: Payer: Self-pay

## 2023-09-27 ENCOUNTER — Ambulatory Visit: Payer: Self-pay | Admitting: Infectious Disease

## 2023-09-27 VITALS — BP 132/80 | HR 92 | Temp 98.2°F | Ht 64.0 in | Wt 183.0 lb

## 2023-09-27 DIAGNOSIS — B20 Human immunodeficiency virus [HIV] disease: Secondary | ICD-10-CM

## 2023-09-27 DIAGNOSIS — E785 Hyperlipidemia, unspecified: Secondary | ICD-10-CM

## 2023-09-27 DIAGNOSIS — R739 Hyperglycemia, unspecified: Secondary | ICD-10-CM

## 2023-09-27 DIAGNOSIS — Z7185 Encounter for immunization safety counseling: Secondary | ICD-10-CM

## 2023-09-27 DIAGNOSIS — Z23 Encounter for immunization: Secondary | ICD-10-CM

## 2023-09-27 LAB — URINE CYTOLOGY ANCILLARY ONLY
Chlamydia: NEGATIVE
Comment: NEGATIVE
Comment: NORMAL
Neisseria Gonorrhea: NEGATIVE

## 2023-09-27 MED ORDER — SYMTUZA 800-150-200-10 MG PO TABS: 1.0000 | ORAL_TABLET | Freq: Every day | ORAL | 11 refills | Status: DC

## 2023-09-27 MED ORDER — PITAVASTATIN MAGNESIUM 4 MG PO TABS: 4.0000 mg | ORAL_TABLET | Freq: Every day | ORAL | 11 refills | Status: DC

## 2023-09-27 NOTE — Addendum Note (Signed)
 Addended by: CELESTIA LELA HERO on: 09/27/2023 10:21 AM   Modules accepted: Orders

## 2023-09-28 LAB — T-HELPER CELLS (CD4) COUNT (NOT AT ARMC)
CD4 % Helper T Cell: 19 % — ABNORMAL LOW (ref 33–65)
CD4 T Cell Abs: 475 /uL (ref 400–1790)

## 2023-09-29 LAB — LIPID PANEL
Cholesterol: 225 mg/dL — ABNORMAL HIGH (ref <200)
HDL: 48 mg/dL (ref >=40)
LDL Cholesterol (Calc): 133 mg/dL — ABNORMAL HIGH
Non-HDL Cholesterol (Calc): 177 mg/dL — ABNORMAL HIGH (ref <130)
Total CHOL/HDL Ratio: 4.7 (calc) (ref <5.0)
Triglycerides: 283 mg/dL — ABNORMAL HIGH (ref <150)

## 2023-09-29 LAB — CBC WITH DIFFERENTIAL/PLATELET
Absolute Lymphocytes: 2746 {cells}/uL (ref 850–3900)
Absolute Monocytes: 952 {cells}/uL — ABNORMAL HIGH (ref 200–950)
Basophils Absolute: 62 {cells}/uL (ref 0–200)
Basophils Relative: 0.4 %
Eosinophils Absolute: 265 {cells}/uL (ref 15–500)
Eosinophils Relative: 1.7 %
HCT: 44.3 % (ref 38.5–50.0)
Hemoglobin: 15.2 g/dL (ref 13.2–17.1)
MCH: 33.6 pg — ABNORMAL HIGH (ref 27.0–33.0)
MCHC: 34.3 g/dL (ref 32.0–36.0)
MCV: 98 fL (ref 80.0–100.0)
MPV: 11 fL (ref 7.5–12.5)
Monocytes Relative: 6.1 %
Neutro Abs: 11575 {cells}/uL — ABNORMAL HIGH (ref 1500–7800)
Neutrophils Relative %: 74.2 %
Platelets: 244 Thousand/uL (ref 140–400)
RBC: 4.52 Million/uL (ref 4.20–5.80)
RDW: 13 % (ref 11.0–15.0)
Total Lymphocyte: 17.6 %
WBC: 15.6 Thousand/uL — ABNORMAL HIGH (ref 3.8–10.8)

## 2023-09-29 LAB — COMPLETE METABOLIC PANEL WITHOUT GFR
AG Ratio: 2 (calc) (ref 1.0–2.5)
ALT: 54 U/L — ABNORMAL HIGH (ref 9–46)
AST: 40 U/L (ref 10–40)
Albumin: 4.5 g/dL (ref 3.6–5.1)
Alkaline phosphatase (APISO): 92 U/L (ref 36–130)
BUN: 12 mg/dL (ref 7–25)
CO2: 21 mmol/L (ref 20–32)
Calcium: 9.3 mg/dL (ref 8.6–10.3)
Chloride: 107 mmol/L (ref 98–110)
Creat: 0.7 mg/dL (ref 0.60–1.29)
Globulin: 2.3 g/dL (ref 1.9–3.7)
Glucose, Bld: 100 mg/dL — ABNORMAL HIGH (ref 65–99)
Potassium: 4.3 mmol/L (ref 3.5–5.3)
Sodium: 136 mmol/L (ref 135–146)
Total Bilirubin: 0.5 mg/dL (ref 0.2–1.2)
Total Protein: 6.8 g/dL (ref 6.1–8.1)

## 2023-09-29 LAB — RPR: RPR Ser Ql: NONREACTIVE

## 2023-09-29 LAB — HIV-1 RNA QUANT-NO REFLEX-BLD
HIV 1 RNA Quant: NOT DETECTED {copies}/mL
HIV-1 RNA Quant, Log: NOT DETECTED {Log_copies}/mL

## 2023-09-29 LAB — HEMOGLOBIN A1C
Hgb A1c MFr Bld: 6.2 % — ABNORMAL HIGH (ref <5.7)
Mean Plasma Glucose: 131 mg/dL
eAG (mmol/L): 7.3 mmol/L

## 2023-10-01 ENCOUNTER — Ambulatory Visit: Payer: Self-pay

## 2023-10-01 NOTE — Telephone Encounter (Signed)
-----   Message from [place] sent at 09/28/2023  1:30 PM EDT ----- He is becoming prediabetic almost diabetic if he can lose weight he can reverse this ----- Message ----- From: Interface, [facility] Sent: 09/27/2023   9:20 PM EDT To: Jomarie LOISE Fleeta Kathie, MD

## 2023-10-01 NOTE — Telephone Encounter (Signed)
 I spoke to the patient using Spanish interpreter Jannette ID [phone]. Patient advised of results and to make dietary changes and weight loss to help with this.  Patient verbalized understanding Curtis Burton, CMA

## 2023-10-31 ENCOUNTER — Ambulatory Visit: Payer: Self-pay

## 2023-10-31 ENCOUNTER — Other Ambulatory Visit: Payer: Self-pay

## 2023-10-31 DIAGNOSIS — Z23 Encounter for immunization: Secondary | ICD-10-CM

## 2024-03-30 NOTE — Progress Notes (Unsigned)
" ° °  Subjective:  Chief complaint: follow-up for HIV disease on medications   Patient ID: Curtis Burton, male    DOB: 1981/02/19, 44 y.o.   MRN: [id number]  HPI  Past Medical History:  Diagnosis Date   Anemia    HIV infection (HCC)    Hyperlipidemia 10/20/2021   Retinitis    PERIFERAL FOCAL   Smoker 04/06/2021   UTI (urinary tract infection)    Vaccine counseling 04/06/2021    Past Surgical History:  Procedure Laterality Date   NO PAST SURGERIES      No family history on file.    Social History   Socioeconomic History   Marital status: Single    Spouse name: Not on file   Number of children: Not on file   Years of education: Not on file   Highest education level: Not on file  Occupational History   Not on file  Tobacco Use   Smoking status: Every Day    Current packs/day: 0.25    Average packs/day: 0.3 packs/day for 15.0 years (3.8 ttl pk-yrs)    Types: Cigarettes   Smokeless tobacco: Never   Tobacco comments:    slowing down on cigs    1 pack every 2 days  Substance and Sexual Activity   Alcohol use: Yes    Alcohol/week: 2.0 standard drinks of alcohol    Types: 2 Shots of liquor per week    Comment: weekends   Drug use: No   Sexual activity: Yes    Partners: Female  Other Topics Concern   Not on file  Social History Narrative   Not on file   Social Drivers of Health   Tobacco Use: High Risk (09/27/2023)   Patient History    Smoking Tobacco Use: Every Day    Smokeless Tobacco Use: Never    Passive Exposure: Not on file  Financial Resource Strain: Not on file  Food Insecurity: Not on file  Transportation Needs: Not on file  Physical Activity: Not on file  Stress: Not on file  Social Connections: Not on file  Depression (PHQ2-9): Low Risk (09/27/2023)   Depression (PHQ2-9)    PHQ-2 Score: 0  Alcohol Screen: Not on file  Housing: Not on file  Utilities: Not on file  Health Literacy: Not on file    Allergies[1]  Current  Medications[2]   Review of Systems     Objective:   Physical Exam        Assessment & Plan:       [1] No Known Allergies [2]  Current Outpatient Medications:    Darunavir -Cobicistat -Emtricitabine -Tenofovir  Alafenamide (SYMTUZA ) 800-150-200-10 MG TABS, Take 1 tablet by mouth daily with breakfast., Disp: 30 tablet, Rfl: 11   Pitavastatin  Magnesium  4 MG TABS, Take 1 tablet (4 mg total) by mouth daily., Disp: 30 tablet, Rfl: 11  "

## 2024-04-03 ENCOUNTER — Other Ambulatory Visit: Payer: Self-pay

## 2024-04-03 ENCOUNTER — Ambulatory Visit: Payer: Self-pay | Admitting: Infectious Disease

## 2024-04-03 VITALS — BP 134/74 | HR 89 | Temp 98.1°F | Wt 186.0 lb

## 2024-04-03 DIAGNOSIS — Z23 Encounter for immunization: Secondary | ICD-10-CM

## 2024-04-03 DIAGNOSIS — R7303 Prediabetes: Secondary | ICD-10-CM

## 2024-04-03 DIAGNOSIS — E785 Hyperlipidemia, unspecified: Secondary | ICD-10-CM

## 2024-04-03 DIAGNOSIS — Z7185 Encounter for immunization safety counseling: Secondary | ICD-10-CM

## 2024-04-03 DIAGNOSIS — B20 Human immunodeficiency virus [HIV] disease: Secondary | ICD-10-CM

## 2024-04-03 MED ORDER — SYMTUZA 800-150-200-10 MG PO TABS: 1.0000 | ORAL_TABLET | Freq: Every day | ORAL | 11 refills | Status: AC

## 2024-04-03 MED ORDER — PITAVASTATIN MAGNESIUM 4 MG PO TABS: 4.0000 mg | ORAL_TABLET | Freq: Every day | ORAL | 11 refills | Status: AC

## 2024-04-04 LAB — COMPLETE METABOLIC PANEL WITHOUT GFR
AG Ratio: 1.8 (calc) (ref 1.0–2.5)
ALT: 35 U/L (ref 9–46)
AST: 20 U/L (ref 10–40)
Albumin: 4.3 g/dL (ref 3.6–5.1)
Alkaline phosphatase (APISO): 87 U/L (ref 36–130)
BUN: 13 mg/dL (ref 7–25)
CO2: 25 mmol/L (ref 20–32)
Calcium: 9.3 mg/dL (ref 8.6–10.3)
Chloride: 106 mmol/L (ref 98–110)
Creat: 0.65 mg/dL (ref 0.60–1.29)
Globulin: 2.4 g/dL (ref 1.9–3.7)
Glucose, Bld: 136 mg/dL — ABNORMAL HIGH (ref 65–99)
Potassium: 4.3 mmol/L (ref 3.5–5.3)
Sodium: 138 mmol/L (ref 135–146)
Total Bilirubin: 0.5 mg/dL (ref 0.2–1.2)
Total Protein: 6.7 g/dL (ref 6.1–8.1)

## 2024-04-04 LAB — LIPID PANEL
Cholesterol: 200 mg/dL — ABNORMAL HIGH
HDL: 44 mg/dL
LDL Cholesterol (Calc): 126 mg/dL — ABNORMAL HIGH
Non-HDL Cholesterol (Calc): 156 mg/dL — ABNORMAL HIGH
Total CHOL/HDL Ratio: 4.5 (calc)
Triglycerides: 189 mg/dL — ABNORMAL HIGH

## 2024-04-04 LAB — CBC WITH DIFFERENTIAL/PLATELET
Absolute Lymphocytes: 2174 {cells}/uL (ref 850–3900)
Absolute Monocytes: 410 {cells}/uL (ref 200–950)
Basophils Absolute: 32 {cells}/uL (ref 0–200)
Basophils Relative: 0.5 %
Eosinophils Absolute: 189 {cells}/uL (ref 15–500)
Eosinophils Relative: 3 %
HCT: 41.1 % (ref 39.4–51.1)
Hemoglobin: 14.6 g/dL (ref 13.2–17.1)
MCH: 33.3 pg — ABNORMAL HIGH (ref 27.0–33.0)
MCHC: 35.5 g/dL — ABNORMAL HIGH (ref 31.6–35.4)
MCV: 93.8 fL (ref 81.4–101.7)
MPV: 11.3 fL (ref 7.5–12.5)
Monocytes Relative: 6.5 %
Neutro Abs: 3497 {cells}/uL (ref 1500–7800)
Neutrophils Relative %: 55.5 %
Platelets: 292 10*3/uL (ref 140–400)
RBC: 4.38 Million/uL (ref 4.20–5.80)
RDW: 12.7 % (ref 11.0–15.0)
Total Lymphocyte: 34.5 %
WBC: 6.3 10*3/uL (ref 3.8–10.8)

## 2024-04-04 LAB — URINE CYTOLOGY ANCILLARY ONLY
Chlamydia: NEGATIVE
Comment: NEGATIVE
Comment: NORMAL
Neisseria Gonorrhea: NEGATIVE

## 2024-04-04 LAB — HEMOGLOBIN A1C
Hgb A1c MFr Bld: 6 % — ABNORMAL HIGH
Mean Plasma Glucose: 126 mg/dL
eAG (mmol/L): 7 mmol/L

## 2024-04-04 LAB — T-HELPER CELLS (CD4) COUNT (NOT AT ARMC)
CD4 % Helper T Cell: 21 % — ABNORMAL LOW (ref 33–65)
CD4 T Cell Abs: 420 /uL (ref 400–1790)

## 2024-04-04 LAB — SYPHILIS: RPR W/REFLEX TO RPR TITER AND TREPONEMAL ANTIBODIES, TRADITIONAL SCREENING AND DIAGNOSIS ALGORITHM: RPR Ser Ql: NONREACTIVE

## 2024-04-29 ENCOUNTER — Ambulatory Visit: Payer: Self-pay

## 2024-10-06 ENCOUNTER — Ambulatory Visit: Payer: Self-pay | Admitting: Infectious Disease
